# Patient Record
Sex: Male | Born: 1995 | Smoking: Former smoker
Health system: Southern US, Community
[De-identification: ages and names within clinical notes are randomized; demographics above are authoritative.]

## PROBLEM LIST (undated history)

## (undated) DIAGNOSIS — R569 Unspecified convulsions: Secondary | ICD-10-CM

## (undated) HISTORY — PX: OTHER SURGICAL HISTORY: SHX169

---
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
## 2016-06-04 ENCOUNTER — Emergency Department (HOSPITAL_COMMUNITY)
Admission: EM | Admit: 2016-06-04 | Discharge: 2016-06-04 | Disposition: A | Discharge status: Home / Self Care | Payer: Managed Care, Other (non HMO) | Attending: Emergency Medicine | Admitting: Emergency Medicine

## 2016-06-04 ENCOUNTER — Encounter (HOSPITAL_COMMUNITY): Payer: Self-pay | Admitting: Emergency Medicine

## 2016-06-04 DIAGNOSIS — R0981 Nasal congestion: Secondary | ICD-10-CM | POA: Insufficient documentation

## 2016-06-04 DIAGNOSIS — R05 Cough: Secondary | ICD-10-CM

## 2016-06-04 DIAGNOSIS — R059 Cough, unspecified: Secondary | ICD-10-CM

## 2016-06-04 DIAGNOSIS — J029 Acute pharyngitis, unspecified: Secondary | ICD-10-CM | POA: Insufficient documentation

## 2016-06-04 DIAGNOSIS — Z79899 Other long term (current) drug therapy: Secondary | ICD-10-CM | POA: Diagnosis not present

## 2016-06-04 MED ORDER — DEXAMETHASONE SODIUM PHOSPHATE 10 MG/ML IJ SOLN
10.0000 mg | Freq: Once | INTRAMUSCULAR | Status: AC
  Administered 2016-06-04: 10 mg via INTRAMUSCULAR
  Filled 2016-06-04: qty 1

## 2016-06-04 MED ORDER — PENICILLIN G BENZATHINE 1200000 UNIT/2ML IM SUSP
1.2000 10*6.[IU] | Freq: Once | INTRAMUSCULAR | Status: AC
  Administered 2016-06-04: 1.2 10*6.[IU] via INTRAMUSCULAR
  Filled 2016-06-04: qty 2

## 2016-06-04 NOTE — Discharge Instructions (Signed)
You have been treated for strep throat.  You were also given some steroids to help with the tonsillar swelling. Follow-up with your primary care doctor. Return here for new concerns.

## 2016-06-04 NOTE — ED Notes (Signed)
Pt. reports sore throat with swelling , productive cough / nasal congestion onset today . Denies fever or chills. Respirations unlabored /airway intact .

## 2016-06-04 NOTE — ED Notes (Signed)
PA at bedside.

## 2016-06-04 NOTE — ED Provider Notes (Signed)
CSN: [id number]     Arrival date & time 06/04/16  0101 History   First MD Initiated Contact with Patient 06/04/16 0136     Chief Complaint  Patient presents with  . Sore Throat     (Consider location/radiation/quality/duration/timing/severity/associated sxs/prior Treatment) Patient is a 20 y.o. male presenting with pharyngitis. The history is provided by the patient and medical records.  Sore Throat Associated symptoms include congestion, coughing and a sore throat.    20 y.o. M with hx of seizure disorder, presenting to the ED for sore throat, nasal congestion, and dry cough which began yesterday afternoon.  He denies fever or chills.  States today when he went to lay down in bed he had a sensation of throat swelling that resolved once he sat upright again.  He states hx of strep throat in the past several years ago.  Girlfriend recently sick with URI as well.  No meds tried PTA.  No known allergies.  No new foods.  No chest pain or shortness of breath. No nausea, vomiting, diarrhea. No abdominal pain.  VSS.  Past Medical History  Diagnosis Date  . Seizures (HCC)    History reviewed. No pertinent past surgical history. No family history on file. Social History  Substance Use Topics  . Smoking status: Never Smoker   . Smokeless tobacco: None  . Alcohol Use: Yes    Review of Systems  HENT: Positive for congestion and sore throat.   Respiratory: Positive for cough.   All other systems reviewed and are negative.     Allergies  Review of patient's allergies indicates no known allergies.  Home Medications   Prior to Admission medications   Medication Sig Start Date End Date Taking? Authorizing Provider  aluminum & magnesium hydroxide-simethicone (MYLANTA) 500-450-40 MG/5ML suspension Take 10-20 mLs by mouth daily as needed for indigestion.    Historical Provider, MD  Benzocaine 10 MG LOZG Use as directed 1 lozenge in the mouth or throat every 4 (four) hours as needed (for sore  throat).    Historical Provider, MD  diazepam (VALIUM) 5 MG/ML solution Take 10 mg by mouth once. For seizure ,  IM stat and call MD on staff    Historical Provider, MD  Multiple Vitamin (MULTIVITAMIN WITH MINERALS) TABS tablet Take 1 tablet by mouth daily.    Historical Provider, MD  polyethylene glycol (MIRALAX / GLYCOLAX) packet Take 17 g by mouth daily as needed for moderate constipation.    Historical Provider, MD  thiamine 100 MG tablet Take 100 mg by mouth daily.    Historical Provider, MD   BP 123/72 mmHg  Pulse 63  Temp(Src) 97.4 F (36.3 C) (Oral)  Resp 18  Ht 5\' 9"  (1.753 m)  Wt 79.833 kg  BMI 25.98 kg/m2  SpO2 98%   Physical Exam  Constitutional: He is oriented to person, place, and time. He appears well-developed and well-nourished. No distress.  HENT:  Head: Normocephalic and atraumatic.  Right Ear: Tympanic membrane and ear canal normal.  Left Ear: Tympanic membrane and ear canal normal.  Nose: Nose normal.  Mouth/Throat: Uvula is midline and mucous membranes are normal. Oropharyngeal exudate present. No tonsillar abscesses.  Tonsils 2+ bilaterally with small pocket of exudate noted on right tonsil; uvula midline without evidence of peritonsillar abscess; handling secretions appropriately; no difficulty swallowing or speaking; normal phonation without stridor  Eyes: Conjunctivae and EOM are normal. Pupils are equal, round, and reactive to light.  Neck: Normal range of motion. Neck supple.  Cardiovascular: Normal rate, regular rhythm and normal heart sounds.   Pulmonary/Chest: Effort normal and breath sounds normal. No respiratory distress. He has no wheezes.  Musculoskeletal: Normal range of motion.  Neurological: He is alert and oriented to person, place, and time.  Skin: Skin is warm and dry. He is not diaphoretic.  Psychiatric: He has a normal mood and affect.  Nursing note and vitals reviewed.   ED Course  Procedures (including critical care time) Labs  Review Labs Reviewed - No data to display  Imaging Review No results found. I have personally reviewed and evaluated these images and lab results as part of my medical decision-making.   EKG Interpretation None      MDM   Final diagnoses:  Sore throat  Nasal congestion  Cough   20 year old male here with sore throat, nasal congestion, and cough for the past 24 hours. Patient is afebrile, nontoxic. His lungs are clear without wheezes or rhonchi to suggest pneumonia. He has no cough on my exam. His tonsils are enlarged bilaterally with small pocket of exudate on the right tonsil. Uvula remains midline, handling secretions well, normal phonation without stridor. Airway patent.  Do not clinically suspect peritonsillar abscess or anaphylaxis, more likely strep pharyngitis. Will treat with Bicillin and Decadron here in the ED.  Discussed plan with patient, he acknowledged understanding and agreed with plan of care.  Return precautions given for new or worsening symptoms.  Garlon HatchetLisa M Sanders, PA-C 06/04/16 [id number]  Rolland PorterMark James, MD 06/15/16 [phone]

## 2018-03-13 ENCOUNTER — Ambulatory Visit: Payer: Managed Care, Other (non HMO) | Admitting: Family Medicine

## 2018-03-13 DIAGNOSIS — Z0289 Encounter for other administrative examinations: Secondary | ICD-10-CM

## 2018-03-24 ENCOUNTER — Encounter (HOSPITAL_COMMUNITY): Payer: Self-pay

## 2018-03-24 ENCOUNTER — Other Ambulatory Visit: Payer: Self-pay

## 2018-03-24 ENCOUNTER — Inpatient Hospital Stay (HOSPITAL_COMMUNITY)
Admission: EM | Admit: 2018-03-24 | Discharge: 2018-03-25 | DRG: 897 | Disposition: A | Discharge status: Home / Self Care | Payer: Managed Care, Other (non HMO) | Attending: Internal Medicine | Admitting: Internal Medicine

## 2018-03-24 ENCOUNTER — Emergency Department (HOSPITAL_COMMUNITY): Payer: Managed Care, Other (non HMO)

## 2018-03-24 ENCOUNTER — Ambulatory Visit (HOSPITAL_COMMUNITY): Payer: Self-pay

## 2018-03-24 DIAGNOSIS — F13239 Sedative, hypnotic or anxiolytic dependence with withdrawal, unspecified: Secondary | ICD-10-CM | POA: Diagnosis present

## 2018-03-24 DIAGNOSIS — S0012XA Contusion of left eyelid and periocular area, initial encounter: Secondary | ICD-10-CM | POA: Diagnosis present

## 2018-03-24 DIAGNOSIS — F1910 Other psychoactive substance abuse, uncomplicated: Secondary | ICD-10-CM | POA: Diagnosis not present

## 2018-03-24 DIAGNOSIS — G4089 Other seizures: Secondary | ICD-10-CM | POA: Diagnosis present

## 2018-03-24 DIAGNOSIS — F1110 Opioid abuse, uncomplicated: Secondary | ICD-10-CM | POA: Diagnosis present

## 2018-03-24 DIAGNOSIS — R569 Unspecified convulsions: Secondary | ICD-10-CM | POA: Diagnosis not present

## 2018-03-24 DIAGNOSIS — F10239 Alcohol dependence with withdrawal, unspecified: Secondary | ICD-10-CM | POA: Diagnosis present

## 2018-03-24 DIAGNOSIS — H05232 Hemorrhage of left orbit: Secondary | ICD-10-CM | POA: Diagnosis not present

## 2018-03-24 DIAGNOSIS — S0081XA Abrasion of other part of head, initial encounter: Secondary | ICD-10-CM | POA: Diagnosis present

## 2018-03-24 DIAGNOSIS — F1010 Alcohol abuse, uncomplicated: Secondary | ICD-10-CM | POA: Diagnosis present

## 2018-03-24 DIAGNOSIS — S0083XA Contusion of other part of head, initial encounter: Secondary | ICD-10-CM | POA: Diagnosis present

## 2018-03-24 DIAGNOSIS — F19230 Other psychoactive substance dependence with withdrawal, uncomplicated: Secondary | ICD-10-CM | POA: Diagnosis not present

## 2018-03-24 DIAGNOSIS — Z7151 Drug abuse counseling and surveillance of drug abuser: Secondary | ICD-10-CM | POA: Diagnosis not present

## 2018-03-24 DIAGNOSIS — T07XXXA Unspecified multiple injuries, initial encounter: Secondary | ICD-10-CM | POA: Diagnosis present

## 2018-03-24 DIAGNOSIS — F17210 Nicotine dependence, cigarettes, uncomplicated: Secondary | ICD-10-CM | POA: Diagnosis present

## 2018-03-24 DIAGNOSIS — F19239 Other psychoactive substance dependence with withdrawal, unspecified: Secondary | ICD-10-CM

## 2018-03-24 DIAGNOSIS — F19939 Other psychoactive substance use, unspecified with withdrawal, unspecified: Secondary | ICD-10-CM

## 2018-03-24 DIAGNOSIS — Z888 Allergy status to other drugs, medicaments and biological substances status: Secondary | ICD-10-CM | POA: Diagnosis not present

## 2018-03-24 DIAGNOSIS — E876 Hypokalemia: Secondary | ICD-10-CM | POA: Diagnosis present

## 2018-03-24 DIAGNOSIS — F19231 Other psychoactive substance dependence with withdrawal delirium: Secondary | ICD-10-CM | POA: Diagnosis not present

## 2018-03-24 LAB — RAPID URINE DRUG SCREEN, HOSP PERFORMED
Amphetamines: POSITIVE — AB
Barbiturates: NOT DETECTED
Benzodiazepines: POSITIVE — AB
Cocaine: NOT DETECTED
Opiates: NOT DETECTED
Tetrahydrocannabinol: POSITIVE — AB

## 2018-03-24 LAB — COMPREHENSIVE METABOLIC PANEL
ALT: 18 U/L (ref 17–63)
AST: 28 U/L (ref 15–41)
Albumin: 4.4 g/dL (ref 3.5–5.0)
Alkaline Phosphatase: 47 U/L (ref 38–126)
Anion gap: 10 (ref 5–15)
BUN: 5 mg/dL — ABNORMAL LOW (ref 6–20)
CO2: 21 mmol/L — ABNORMAL LOW (ref 22–32)
Calcium: 9 mg/dL (ref 8.9–10.3)
Chloride: 106 mmol/L (ref 101–111)
Creatinine, Ser: 0.9 mg/dL (ref 0.61–1.24)
GFR calc Af Amer: >60 mL/min (ref >60)
GFR calc non Af Amer: >60 mL/min (ref >60)
Glucose, Bld: 103 mg/dL — ABNORMAL HIGH (ref 65–99)
Potassium: 3.7 mmol/L (ref 3.5–5.1)
Sodium: 137 mmol/L (ref 135–145)
Total Bilirubin: 1.7 mg/dL — ABNORMAL HIGH (ref 0.3–1.2)
Total Protein: 6.5 g/dL (ref 6.5–8.1)

## 2018-03-24 LAB — CBC WITH DIFFERENTIAL/PLATELET
Basophils Absolute: 0 10*3/uL (ref 0.0–0.1)
Basophils Relative: 0 %
Eosinophils Absolute: 0 10*3/uL (ref 0.0–0.7)
Eosinophils Relative: 0 %
HCT: 42.5 % (ref 39.0–52.0)
Hemoglobin: 15.2 g/dL (ref 13.0–17.0)
Lymphocytes Relative: 9 %
Lymphs Abs: 0.8 10*3/uL (ref 0.7–4.0)
MCH: 29.5 pg (ref 26.0–34.0)
MCHC: 35.8 g/dL (ref 30.0–36.0)
MCV: 82.5 fL (ref 78.0–100.0)
Monocytes Absolute: 0.3 10*3/uL (ref 0.1–1.0)
Monocytes Relative: 3 %
Neutro Abs: 7.9 10*3/uL — ABNORMAL HIGH (ref 1.7–7.7)
Neutrophils Relative %: 88 %
Platelets: 235 10*3/uL (ref 150–400)
RBC: 5.15 MIL/uL (ref 4.22–5.81)
RDW: 12.1 % (ref 11.5–15.5)
WBC: 9 10*3/uL (ref 4.0–10.5)

## 2018-03-24 LAB — PHOSPHORUS: Phosphorus: 1.3 mg/dL — ABNORMAL LOW (ref 2.5–4.6)

## 2018-03-24 LAB — TSH: TSH: 0.365 u[IU]/mL (ref 0.350–4.500)

## 2018-03-24 LAB — MAGNESIUM: Magnesium: 2.5 mg/dL — ABNORMAL HIGH (ref 1.7–2.4)

## 2018-03-24 MED ORDER — ONDANSETRON HCL 4 MG/2ML IJ SOLN: 4.0000 mg | Freq: Four times a day (QID) | INTRAMUSCULAR | Status: DC | PRN

## 2018-03-24 MED ORDER — IBUPROFEN 400 MG PO TABS
400.0000 mg | ORAL_TABLET | Freq: Four times a day (QID) | ORAL | Status: DC | PRN
  Administered 2018-03-24 – 2018-03-25 (×2): 400 mg via ORAL
  Filled 2018-03-24 (×2): qty 1

## 2018-03-24 MED ORDER — LORAZEPAM 2 MG/ML IJ SOLN
0.0000 mg | Freq: Four times a day (QID) | INTRAMUSCULAR | Status: DC
  Administered 2018-03-24: 2 mg via INTRAVENOUS
  Filled 2018-03-24: qty 1

## 2018-03-24 MED ORDER — LORAZEPAM 2 MG/ML IJ SOLN
2.0000 mg | INTRAMUSCULAR | Status: DC | PRN
  Administered 2018-03-25 (×2): 2 mg via INTRAVENOUS
  Filled 2018-03-24 (×2): qty 1

## 2018-03-24 MED ORDER — SODIUM CHLORIDE 0.9% FLUSH
3.0000 mL | Freq: Two times a day (BID) | INTRAVENOUS | Status: DC
  Administered 2018-03-24: 3 mL via INTRAVENOUS

## 2018-03-24 MED ORDER — SODIUM CHLORIDE 0.9 % IV BOLUS
1000.0000 mL | Freq: Once | INTRAVENOUS | Status: AC
  Administered 2018-03-24: 1000 mL via INTRAVENOUS

## 2018-03-24 MED ORDER — BACITRACIN-NEOMYCIN-POLYMYXIN OINTMENT TUBE
TOPICAL_OINTMENT | Freq: Every day | CUTANEOUS | Status: DC
  Administered 2018-03-24: 23:00:00 via TOPICAL
  Administered 2018-03-25: 1 via TOPICAL
  Filled 2018-03-24 (×2): qty 1
  Filled 2018-03-24: qty 14.17

## 2018-03-24 MED ORDER — TETANUS-DIPHTH-ACELL PERTUSSIS 5-2.5-18.5 LF-MCG/0.5 IM SUSP
0.5000 mL | Freq: Once | INTRAMUSCULAR | Status: AC
  Administered 2018-03-24: 0.5 mL via INTRAMUSCULAR
  Filled 2018-03-24: qty 0.5

## 2018-03-24 MED ORDER — LORAZEPAM 1 MG PO TABS: 0.0000 mg | ORAL_TABLET | Freq: Two times a day (BID) | ORAL | Status: DC

## 2018-03-24 MED ORDER — ACETAMINOPHEN 650 MG RE SUPP: 650.0000 mg | Freq: Four times a day (QID) | RECTAL | Status: DC | PRN

## 2018-03-24 MED ORDER — KCL IN DEXTROSE-NACL 20-5-0.9 MEQ/L-%-% IV SOLN: INTRAVENOUS | Status: DC

## 2018-03-24 MED ORDER — VITAMIN B-1 100 MG PO TABS
100.0000 mg | ORAL_TABLET | Freq: Every day | ORAL | Status: DC
  Administered 2018-03-24: 100 mg via ORAL
  Filled 2018-03-24: qty 1

## 2018-03-24 MED ORDER — NICOTINE 14 MG/24HR TD PT24
14.0000 mg | MEDICATED_PATCH | Freq: Every day | TRANSDERMAL | Status: DC
  Administered 2018-03-24 – 2018-03-25 (×2): 14 mg via TRANSDERMAL
  Filled 2018-03-24 (×2): qty 1

## 2018-03-24 MED ORDER — KCL IN DEXTROSE-NACL 20-5-0.9 MEQ/L-%-% IV SOLN
INTRAVENOUS | Status: AC
  Administered 2018-03-24: 23:00:00 via INTRAVENOUS
  Filled 2018-03-24: qty 1000

## 2018-03-24 MED ORDER — LORAZEPAM 2 MG/ML IJ SOLN: 0.0000 mg | Freq: Two times a day (BID) | INTRAMUSCULAR | Status: DC

## 2018-03-24 MED ORDER — THIAMINE HCL 100 MG/ML IJ SOLN: 100.0000 mg | Freq: Every day | INTRAMUSCULAR | Status: DC

## 2018-03-24 MED ORDER — ENOXAPARIN SODIUM 40 MG/0.4ML ~~LOC~~ SOLN: 40.0000 mg | SUBCUTANEOUS | Status: DC

## 2018-03-24 MED ORDER — LORAZEPAM 1 MG PO TABS: 0.0000 mg | ORAL_TABLET | Freq: Four times a day (QID) | ORAL | Status: DC

## 2018-03-24 MED ORDER — ONDANSETRON HCL 4 MG PO TABS: 4.0000 mg | ORAL_TABLET | Freq: Four times a day (QID) | ORAL | Status: DC | PRN

## 2018-03-24 MED ORDER — ACETAMINOPHEN 325 MG PO TABS: 650.0000 mg | ORAL_TABLET | Freq: Four times a day (QID) | ORAL | Status: DC | PRN

## 2018-03-24 MED ORDER — POTASSIUM PHOSPHATES 15 MMOLE/5ML IV SOLN
30.0000 mmol | Freq: Once | INTRAVENOUS | Status: AC
  Administered 2018-03-24: 30 mmol via INTRAVENOUS
  Filled 2018-03-24: qty 10

## 2018-03-24 MED ORDER — ENSURE ENLIVE PO LIQD
237.0000 mL | Freq: Two times a day (BID) | ORAL | Status: DC
  Administered 2018-03-25: 237 mL via ORAL

## 2018-03-24 NOTE — ED Notes (Signed)
Peer support bedside at this time

## 2018-03-24 NOTE — ED Triage Notes (Signed)
[facility] with complaints of seizure lasting approx 2 mins per pt's professor. EMS reports pt hit left side of head after falling out of desk; pt has no seizure history nor memory of today's seizure. Pt was not postictal for EMS upon arrival, zofran given en route, no nausea at this time. Pt placed in position of comfort with bed locked and lowered, call bell in reach.

## 2018-03-24 NOTE — ED Notes (Signed)
Pt's mother and mother's friend to nurses' station stating their concern for pt's mental health instability, requesting pt have BH evaluation and become IVC'd for substance abuse treatment. Mother and friend discussing concerns with PA at this time

## 2018-03-24 NOTE — ED Notes (Signed)
Per pt request, please do NOT discuss pt info with family

## 2018-03-24 NOTE — H&P (Addendum)
History and Physical    Gordon Gonzalez:[id number] DOB: November 28, 1996 DOA: 03/24/2018  **Will admit patient based on the expectation that the patient will need hospitalization/ hospital care that crosses at least 2 midnights  PCP: Default, Provider, MD   Attending physician: [facility]  Patient coming from/Resides with: Private residence  Chief Complaint: Witnessed tonic-clonic seizure activity  HPI: Gordon Gonzalez is a 22 y.o. male with medical history significant for polysubstance abuse with primary drug of choice alcohol.  Was last admitted to this facility in September 2015 with 2 days of ongoing nausea vomiting and abdominal pain.  She was found to be profoundly constipated in the context of narcotic bowel.  Was returned to [facility] for detox and continued utilization of Librium, Catapres and Robaxin.  Since that time patient has struggled with polysubstance abuse and typically drinks 1 large block over a 48-hour period.  In addition he admits to using opiates cocaine marijuana.  Over the past 2 days he quit "cold [place]" and apparently was able to obtain at least 1 dose of Xanax to take.  Of note patient is involved with a behavioral health specialist affiliated with [facility] who has been working aggressively with this patient in the outpatient setting to assist with his polysubstance abuse.  Today while talking to a professor in class he had 2 minutes of tonic-clonic seizure activity.  He fell against the floor.  He had no bowel or bladder incontinence.  He was noted to have a left supraorbital hematoma with facial abrasions on the left side after the seizure activity.  He was brought to the ER and has no further seizure activity.  He was afebrile and normotensive.  He is not hypoxemic.  Imaging including CT of the head and cervical spine unremarkable.  His initial CIWA score was somewhat elevated at 11 and he has been started on an Ativan withdrawal protocol.  Patient reported to EDP that  he was concerned over possible hepatitis infection although denies recent IV drug use.  He is also asked that no family members including his mother and her friend be made aware of his current medical status nor should his condition be discussed with him.  He does report that his mother is aware of his ongoing substance abuse problem and informed me that "really do not have anything else to tell her that she does not already know."  ED Course:  Vital Signs: BP 112/73   Pulse 63   Resp 19   Ht 5\' 9"  (1.753 m)   Wt 79.4 kg (175 lb)   SpO2 100%   BMI 25.84 kg/m  CT head, cervical spine and maxillofacial: Negative except for left small periorbital and malar soft tissue hematomas Lab data: 7, potassium 3.7, chloride 106, CO2 21, glucose 105, BUN 5, creatinine 0.9, phosphorus 1.3, magnesium 2.5, total bilirubin 1.7, white count 9000 with neutrophils 88% neutrophils 7.9%, hemoglobin 15.2, platelets 235,000, TSH 0.365 Medications and treatments: We will saline bolus times 1 L, Ativan 2 mg IV x1, thiamine 100 mg p.o. x1, Tdap injection x1  Review of Systems:  In addition to the HPI above,  No Fever-chills, myalgias or other constitutional symptoms No Headache, changes with Vision or hearing, new weakness, tingling, numbness in any extremity, dizziness, dysarthria or word finding difficulty, gait disturbance or imbalance No problems swallowing food or Liquids, indigestion/reflux, choking or coughing while eating, abdominal pain with or after eating No Chest pain, Cough or Shortness of Breath, palpitations, orthopnea or  DOE No Abdominal pain, N/V, melena,hematochezia, dark tarry stools, constipation No dysuria, malodorous urine, hematuria or flank pain No new skin rashes, lesions No new joint pains, aches, swelling or redness No recent unintentional weight gain or loss No polyuria, polydypsia or polyphagia   Past Medical History:  Diagnosis Date  . Seizures (HCC)     History reviewed. No  pertinent surgical history.  Social History   Socioeconomic History  . Marital status: Single    Spouse name: Not on file  . Number of children: Not on file  . Years of education: Not on file  . Highest education level: Not on file  Occupational History  . Not on file  Social Needs  . Financial resource strain: Not on file  . Food insecurity:    Worry: Not on file    Inability: Not on file  . Transportation needs:    Medical: Not on file    Non-medical: Not on file  Tobacco Use  . Smoking status: Never Smoker  . Smokeless tobacco: Never Used  Substance and Sexual Activity  . Alcohol use: Yes  . Drug use: Yes    Types: Cocaine    Comment: Opiates  . Sexual activity: Not on file  Lifestyle  . Physical activity:    Days per week: Not on file    Minutes per session: Not on file  . Stress: Not on file  Relationships  . Social connections:    Talks on phone: Not on file    Gets together: Not on file    Attends religious service: Not on file    Active member of club or organization: Not on file    Attends meetings of clubs or organizations: Not on file    Relationship status: Not on file  . Intimate partner violence:    Fear of current or ex partner: Not on file    Emotionally abused: Not on file    Physically abused: Not on file    Forced sexual activity: Not on file  Other Topics Concern  . Not on file  Social History Narrative  . 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 Wellbutrin [Bupropion] Hives and Swelling    Family history reviewed and not pertinent to current admission findings or diagnosis  Prior to Admission medications   Not on File    Physical Exam: Vitals:   03/24/18 1400 03/24/18 1433 03/24/18 1530 03/24/18 1600  BP:  99/60 116/64 112/73  Pulse:  90 89 63  Resp:  18 13 19   SpO2:  99% 100% 100%  Weight: 79.4 kg (175 lb)       Height: 5\' 9"  (1.753 m)         Constitutional: NAD, calm, uncomfortable secondary to left periorbital hematoma Eyes: PERRL, lids and conjunctivae normal, left periorbital hematoma ENMT: Mucous membranes are moist. Posterior pharynx clear of any exudate or lesions.Normal dentition.  Neck: normal, supple, no masses, no thyromegaly Respiratory: clear to auscultation bilaterally, no wheezing, no crackles. Normal respiratory effort. No accessory muscle use.  Cardiovascular: Regular rate and rhythm, no murmurs / rubs / gallops. No extremity edema. 2+ pedal pulses. No carotid bruits.  Abdomen: no tenderness, no masses palpated. No hepatosplenomegaly. Bowel sounds positive.  Musculoskeletal: no clubbing / cyanosis. No joint deformity upper and lower extremities. Good ROM, no contractures. Normal muscle tone.  Skin: no rashes,  lesions, ulcers. No induration, several scattered left upper face abrasions Neurologic: CN 2-12 grossly intact. Sensation intact, DTR normal. Strength 5/5 x all 4 extremities.  Psychiatric: Normal judgment and insight. Alert and oriented x 3. Normal mood.    Labs on Admission: I have personally reviewed following labs and imaging studies  CBC: Recent Labs  Lab 03/24/18 1504  WBC 9.0  NEUTROABS 7.9*  HGB 15.2  HCT 42.5  MCV 82.5  PLT 235   Basic Metabolic Panel: Recent Labs  Lab 03/24/18 1504  NA 137  K 3.7  CL 106  CO2 21*  GLUCOSE 103*  BUN 5*  CREATININE 0.90  CALCIUM 9.0  MG 2.5*  PHOS 1.3*   GFR: Estimated Creatinine Clearance: 128.7 mL/min (by C-G formula based on SCr of 0.9 mg/dL). Liver Function Tests: Recent Labs  Lab 03/24/18 1504  AST 28  ALT 18  ALKPHOS 47  BILITOT 1.7*  PROT 6.5  ALBUMIN 4.4   No results for input(s): LIPASE, AMYLASE in the last 168 hours. No results for input(s): AMMONIA in the last 168 hours. Coagulation Profile: No results for input(s): INR, PROTIME in the last 168 hours. Cardiac Enzymes: No results for  input(s): CKTOTAL, CKMB, CKMBINDEX, TROPONINI in the last 168 hours. BNP (last 3 results) No results for input(s): PROBNP in the last 8760 hours. HbA1C: No results for input(s): HGBA1C in the last 72 hours. CBG: No results for input(s): GLUCAP in the last 168 hours. Lipid Profile: No results for input(s): CHOL, HDL, LDLCALC, TRIG, CHOLHDL, LDLDIRECT in the last 72 hours. Thyroid Function Tests: Recent Labs    03/24/18 1504  TSH 0.365   Anemia Panel: No results for input(s): VITAMINB12, FOLATE, FERRITIN, TIBC, IRON, RETICCTPCT in the last 72 hours. Urine analysis:    Component Value Date/Time   COLORURINE YELLOW 08/28/2014 1707   APPEARANCEUR CLOUDY (A) 08/28/2014 1707   APPEARANCEUR Clear 04/24/2014 1535   LABSPEC 1.012 08/28/2014 1707   LABSPEC 1.005 04/24/2014 1535   PHURINE 7.5 08/28/2014 1707   GLUCOSEU NEGATIVE 08/28/2014 1707   GLUCOSEU Negative 04/24/2014 1535   HGBUR NEGATIVE 08/28/2014 1707   BILIRUBINUR NEGATIVE 08/28/2014 1707   BILIRUBINUR Negative 04/24/2014 1535   KETONESUR NEGATIVE 08/28/2014 1707   PROTEINUR NEGATIVE 08/28/2014 1707   UROBILINOGEN 0.2 08/28/2014 1707   NITRITE NEGATIVE 08/28/2014 1707   LEUKOCYTESUR NEGATIVE 08/28/2014 1707   LEUKOCYTESUR Negative 04/24/2014 1535   Sepsis Labs: @LABRCNTIP (procalcitonin:4,lacticidven:4) )No results found for this or any previous visit (from the past 240 hour(s)).   Radiological Exams on Admission: Ct Head Wo Contrast  Result Date: 03/24/2018 CLINICAL DATA:  Headache, neck pain, and facial lacerations after seizure with fall today. EXAM: CT HEAD WITHOUT CONTRAST CT MAXILLOFACIAL WITHOUT CONTRAST CT CERVICAL SPINE WITHOUT CONTRAST TECHNIQUE: Multidetector CT imaging of the head, cervical spine, and maxillofacial structures were performed using the standard protocol without intravenous contrast. Multiplanar CT image reconstructions of the cervical spine and maxillofacial structures were also generated.  COMPARISON:  CT head dated Apr 24, 2014. FINDINGS: CT HEAD FINDINGS Brain: No evidence of acute infarction, hemorrhage, hydrocephalus, extra-axial collection or mass lesion/mass effect. Vascular: No hyperdense vessel or unexpected calcification. Skull: Normal. Negative for fracture or focal lesion. Other: None. CT MAXILLOFACIAL FINDINGS Osseous: No fracture or mandibular dislocation. No destructive process. Orbits: Negative. No traumatic or inflammatory finding. Sinuses: Clear. Soft tissues: Small left periorbital and malar soft tissue hematomas. CT CERVICAL SPINE FINDINGS Alignment: Normal. Skull base and vertebrae: No acute fracture. No primary bone lesion  or focal pathologic process. Soft tissues and spinal canal: No prevertebral fluid or swelling. No visible canal hematoma. Disc levels:  Normal. Upper chest: Negative. Other: None. IMPRESSION: 1.  No acute intracranial abnormality. 2. No acute facial fracture. Small left periorbital and malar soft tissue hematomas. 3.  No acute cervical spine fracture. Electronically Signed   By: Obie Dredge M.D.   On: 03/24/2018 15:51   Ct Cervical Spine Wo Contrast  Result Date: 03/24/2018 CLINICAL DATA:  Headache, neck pain, and facial lacerations after seizure with fall today. EXAM: CT HEAD WITHOUT CONTRAST CT MAXILLOFACIAL WITHOUT CONTRAST CT CERVICAL SPINE WITHOUT CONTRAST TECHNIQUE: Multidetector CT imaging of the head, cervical spine, and maxillofacial structures were performed using the standard protocol without intravenous contrast. Multiplanar CT image reconstructions of the cervical spine and maxillofacial structures were also generated. COMPARISON:  CT head dated Apr 24, 2014. FINDINGS: CT HEAD FINDINGS Brain: No evidence of acute infarction, hemorrhage, hydrocephalus, extra-axial collection or mass lesion/mass effect. Vascular: No hyperdense vessel or unexpected calcification. Skull: Normal. Negative for fracture or focal lesion. Other: None. CT  MAXILLOFACIAL FINDINGS Osseous: No fracture or mandibular dislocation. No destructive process. Orbits: Negative. No traumatic or inflammatory finding. Sinuses: Clear. Soft tissues: Small left periorbital and malar soft tissue hematomas. CT CERVICAL SPINE FINDINGS Alignment: Normal. Skull base and vertebrae: No acute fracture. No primary bone lesion or focal pathologic process. Soft tissues and spinal canal: No prevertebral fluid or swelling. No visible canal hematoma. Disc levels:  Normal. Upper chest: Negative. Other: None. IMPRESSION: 1.  No acute intracranial abnormality. 2. No acute facial fracture. Small left periorbital and malar soft tissue hematomas. 3.  No acute cervical spine fracture. Electronically Signed   By: Obie Dredge M.D.   On: 03/24/2018 15:51   Ct Maxillofacial Wo Cm  Result Date: 03/24/2018 CLINICAL DATA:  Headache, neck pain, and facial lacerations after seizure with fall today. EXAM: CT HEAD WITHOUT CONTRAST CT MAXILLOFACIAL WITHOUT CONTRAST CT CERVICAL SPINE WITHOUT CONTRAST TECHNIQUE: Multidetector CT imaging of the head, cervical spine, and maxillofacial structures were performed using the standard protocol without intravenous contrast. Multiplanar CT image reconstructions of the cervical spine and maxillofacial structures were also generated. COMPARISON:  CT head dated Apr 24, 2014. FINDINGS: CT HEAD FINDINGS Brain: No evidence of acute infarction, hemorrhage, hydrocephalus, extra-axial collection or mass lesion/mass effect. Vascular: No hyperdense vessel or unexpected calcification. Skull: Normal. Negative for fracture or focal lesion. Other: None. CT MAXILLOFACIAL FINDINGS Osseous: No fracture or mandibular dislocation. No destructive process. Orbits: Negative. No traumatic or inflammatory finding. Sinuses: Clear. Soft tissues: Small left periorbital and malar soft tissue hematomas. CT CERVICAL SPINE FINDINGS Alignment: Normal. Skull base and vertebrae: No acute fracture. No  primary bone lesion or focal pathologic process. Soft tissues and spinal canal: No prevertebral fluid or swelling. No visible canal hematoma. Disc levels:  Normal. Upper chest: Negative. Other: None. IMPRESSION: 1.  No acute intracranial abnormality. 2. No acute facial fracture. Small left periorbital and malar soft tissue hematomas. 3.  No acute cervical spine fracture. Electronically Signed   By: Obie Dredge M.D.   On: 03/24/2018 15:51     Assessment/Plan Principal Problem:   Withdrawal seizures/ Alcohol dependence with withdrawal with complication  -Patient presents with witnessed tonic-clonic seizure activity in the context of abrupt cessation of heavy alcohol use eating seizure activity occurred 48 hours after cessation (vodka 1/5 over 2 days) -Stepdown Ativan CIWA protocol -Has had no further seizure activity since arrival and no history of seizures-any  seizure precautions for now -Allow clear liquid diet and advance to regular as long as remains seizure-free-advance diet beginning at 9 PM -Patient actively involved in outpatient behavioral health tx with counselor at bedside while in ER-Pt plans to continue aggressive treatment for his drug and alcohol addiction-he has desire to finish this semester at [employer] and eventually graduate with a degree in computer science  Active Problems:    Polysubstance abuse  -History of prior utilization of benzodiazepines, cocaine, meth -Follow-up on urine drug screen -Patient requests screening for hepatitis; will also obtain HIV    Periorbital hematoma of left eye/Multiple abrasions -Ice pack prn to hematoma -Neosporin ointment to abrasions -Tylenol for mild pain -Ibuprofen for moderate pain    Hypophosphatemia -Associated with borderline hypokalemia so we will give K-Phos 30 mmol x1 and follow labs    **Additional lab, imaging and/or diagnostic evaluation at discretion of supervising physician  DVT prophylaxis: Lovenox Code Status:  Full Family Communication: Patient requested family not be made aware of ongoing medical condition and diagnoses-outpatient counselor at bedside and updated on status Disposition Plan: Home Consults called: None    ELLIS,ALLISON L. ANP-BC [facility] Pager [phone]   If 7PM-7AM, please contact night-coverage [email or web address] Password [facility]  03/24/2018, 5:55 PM

## 2018-03-24 NOTE — ED Provider Notes (Signed)
[facility] EMERGENCY DEPARTMENT Provider Note   CSN: [id number] Arrival date & time: 03/24/18  1353     History   Chief Complaint Chief Complaint  Patient presents with  . Seizures    HPI Gordon Gonzalez is a 22 y.o. male with a past medical history of polysubstance abuse, alcohol abuse, who quit all substances "cold [place]" on the morning of 03/22/18, presents today for evaluation of a seizure.  He was reportedly speaking with a professor who has a history of substance abuse when he had an approximately 2-minute tonic-clonic type seizure.  Patient denies any memory of this event.    She does not have any personal or family history of seizures or epilepsy.  He reports that in the past week he has used 2 pills of Xanax, alcohol, hallucinogens, marijuana, and meth.  He reports that alcohol is his primary drug of choice and that he normally gets a large bottle of vodka and will drink it in 2 days.  He reports that he has also used opioids, cocaine, marijuana, and other drugs.  He is unsure when his last tetanus shot was.  He previously used substances however in 2016 he got clean.  He reports that he relapsed in November when he started drinking again and has progressed to using multiple drugs.     HPI  Past Medical History:  Diagnosis Date  . Seizures [facility])     Patient Active Problem List   Diagnosis Date Noted  . Polysubstance abuse (HCC) 03/24/2018  . Alcohol dependence with withdrawal with complication (HCC) 03/24/2018  . Withdrawal seizures (HCC) 03/24/2018  . Opioid abuse (HCC) 08/29/2014  . Unspecified constipation 08/29/2014  . Admitted to substance misuse detoxification center 08/29/2014  . Colonic obstruction (HCC) 08/28/2014    History reviewed. No pertinent surgical history.      Home Medications    Prior to Admission medications   Not on File    Family History History reviewed. No pertinent family history.  Social History Social  History   Tobacco Use  . Smoking status: Never Smoker  . Smokeless tobacco: Never Used  Substance Use Topics  . Alcohol use: Yes  . Drug use: Yes    Types: Cocaine    Comment: Opiates     Allergies   Wellbutrin [bupropion]   Review of Systems Review of Systems  Constitutional: Negative for chills and fever.  HENT: Negative for congestion, postnasal drip and rhinorrhea.   Eyes: Negative for visual disturbance.  Respiratory: Negative for shortness of breath.   Cardiovascular: Negative for chest pain.  Gastrointestinal: Positive for nausea. Negative for abdominal pain, diarrhea and vomiting.  Genitourinary: Negative for dysuria.  Skin: Negative for color change and rash.  Neurological: Positive for seizures and headaches. Negative for dizziness, speech difficulty, weakness and numbness.  Psychiatric/Behavioral: Negative for confusion.  All other systems reviewed and are negative.    Physical Exam Updated Vital Signs BP 112/73   Pulse 63   Resp 19   Ht 5\' 9"  (1.753 m)   Wt 79.4 kg (175 lb)   SpO2 100%   BMI 25.84 kg/m   Physical Exam  Constitutional: He is oriented to person, place, and time. He appears well-developed and well-nourished. No distress.  HENT:  Head: Normocephalic.  Swelling over the left anterior zygomatic arch with TTP.  There is a small area of bleeding.  There is swelling over the left lateral orbit with TTP.  No ocular entrapment.  Abrasions on  left side of chin.   Eyes: Pupils are equal, round, and reactive to light. Conjunctivae and EOM are normal. Right conjunctiva has no hemorrhage. Left conjunctiva has no hemorrhage.  Neck:  Slight midline TTP.   Cardiovascular: Normal rate, regular rhythm, normal heart sounds and intact distal pulses.  No murmur heard. Pulmonary/Chest: Effort normal and breath sounds normal. No respiratory distress.  Abdominal: Soft. There is no tenderness.  Musculoskeletal: He exhibits no edema.  Neurological: He is alert  and oriented to person, place, and time. No sensory deficit.  Mental Status:  Alert, oriented, thought content appropriate, able to give a coherent history. Speech fluent without evidence of aphasia. Able to follow 2 step commands without difficulty.  Cranial Nerves:  II:  Peripheral visual fields grossly normal, pupils equal, round, reactive to light III,IV, VI: ptosis not present, extra-ocular motions intact bilaterally  V,VII: smile symmetric, facial light touch sensation equal VIII: hearing grossly normal to voice  X: uvula elevates symmetrically  XI: bilateral shoulder shrug symmetric and strong XII: midline tongue extension without fassiculations Motor:  Normal tone. 5/5 in upper and lower extremities bilaterally including strong and equal grip strength and dorsiflexion/plantar flexion Cerebellar: normal finger-to-nose with bilateral upper extremities CV: distal pulses palpable throughout    Skin: Skin is warm and dry. He is not diaphoretic.  Psychiatric: He has a normal mood and affect. His speech is normal. He is withdrawn. He expresses no homicidal and no suicidal ideation. He expresses no suicidal plans and no homicidal plans.  Patient denies AVH, SI, or HI.  He denies feeling depressed, hopeless, worthless or questioning if life is worth living.   Nursing note and vitals reviewed.    ED Treatments / Results  Labs (all labs ordered are listed, but only abnormal results are displayed) Labs Reviewed  COMPREHENSIVE METABOLIC PANEL - Abnormal; Notable for the following components:      Result Value   CO2 21 (*)    Glucose, Bld 103 (*)    BUN 5 (*)    Total Bilirubin 1.7 (*)    All other components within normal limits  CBC WITH DIFFERENTIAL/PLATELET - Abnormal; Notable for the following components:   Neutro Abs 7.9 (*)    All other components within normal limits  TSH  RAPID URINE DRUG SCREEN, HOSP PERFORMED  MAGNESIUM  PHOSPHORUS    EKG None  Radiology Ct Head Wo  Contrast  Result Date: 03/24/2018 CLINICAL DATA:  Headache, neck pain, and facial lacerations after seizure with fall today. EXAM: CT HEAD WITHOUT CONTRAST CT MAXILLOFACIAL WITHOUT CONTRAST CT CERVICAL SPINE WITHOUT CONTRAST TECHNIQUE: Multidetector CT imaging of the head, cervical spine, and maxillofacial structures were performed using the standard protocol without intravenous contrast. Multiplanar CT image reconstructions of the cervical spine and maxillofacial structures were also generated. COMPARISON:  CT head dated Apr 24, 2014. FINDINGS: CT HEAD FINDINGS Brain: No evidence of acute infarction, hemorrhage, hydrocephalus, extra-axial collection or mass lesion/mass effect. Vascular: No hyperdense vessel or unexpected calcification. Skull: Normal. Negative for fracture or focal lesion. Other: None. CT MAXILLOFACIAL FINDINGS Osseous: No fracture or mandibular dislocation. No destructive process. Orbits: Negative. No traumatic or inflammatory finding. Sinuses: Clear. Soft tissues: Small left periorbital and malar soft tissue hematomas. CT CERVICAL SPINE FINDINGS Alignment: Normal. Skull base and vertebrae: No acute fracture. No primary bone lesion or focal pathologic process. Soft tissues and spinal canal: No prevertebral fluid or swelling. No visible canal hematoma. Disc levels:  Normal. Upper chest: Negative. Other: None. IMPRESSION: 1.  No acute intracranial abnormality. 2. No acute facial fracture. Small left periorbital and malar soft tissue hematomas. 3.  No acute cervical spine fracture. Electronically Signed   By: Obie Dredge M.D.   On: 03/24/2018 15:51   Ct Cervical Spine Wo Contrast  Result Date: 03/24/2018 CLINICAL DATA:  Headache, neck pain, and facial lacerations after seizure with fall today. EXAM: CT HEAD WITHOUT CONTRAST CT MAXILLOFACIAL WITHOUT CONTRAST CT CERVICAL SPINE WITHOUT CONTRAST TECHNIQUE: Multidetector CT imaging of the head, cervical spine, and maxillofacial structures were  performed using the standard protocol without intravenous contrast. Multiplanar CT image reconstructions of the cervical spine and maxillofacial structures were also generated. COMPARISON:  CT head dated Apr 24, 2014. FINDINGS: CT HEAD FINDINGS Brain: No evidence of acute infarction, hemorrhage, hydrocephalus, extra-axial collection or mass lesion/mass effect. Vascular: No hyperdense vessel or unexpected calcification. Skull: Normal. Negative for fracture or focal lesion. Other: None. CT MAXILLOFACIAL FINDINGS Osseous: No fracture or mandibular dislocation. No destructive process. Orbits: Negative. No traumatic or inflammatory finding. Sinuses: Clear. Soft tissues: Small left periorbital and malar soft tissue hematomas. CT CERVICAL SPINE FINDINGS Alignment: Normal. Skull base and vertebrae: No acute fracture. No primary bone lesion or focal pathologic process. Soft tissues and spinal canal: No prevertebral fluid or swelling. No visible canal hematoma. Disc levels:  Normal. Upper chest: Negative. Other: None. IMPRESSION: 1.  No acute intracranial abnormality. 2. No acute facial fracture. Small left periorbital and malar soft tissue hematomas. 3.  No acute cervical spine fracture. Electronically Signed   By: Obie Dredge M.D.   On: 03/24/2018 15:51   Ct Maxillofacial Wo Cm  Result Date: 03/24/2018 CLINICAL DATA:  Headache, neck pain, and facial lacerations after seizure with fall today. EXAM: CT HEAD WITHOUT CONTRAST CT MAXILLOFACIAL WITHOUT CONTRAST CT CERVICAL SPINE WITHOUT CONTRAST TECHNIQUE: Multidetector CT imaging of the head, cervical spine, and maxillofacial structures were performed using the standard protocol without intravenous contrast. Multiplanar CT image reconstructions of the cervical spine and maxillofacial structures were also generated. COMPARISON:  CT head dated Apr 24, 2014. FINDINGS: CT HEAD FINDINGS Brain: No evidence of acute infarction, hemorrhage, hydrocephalus, extra-axial collection  or mass lesion/mass effect. Vascular: No hyperdense vessel or unexpected calcification. Skull: Normal. Negative for fracture or focal lesion. Other: None. CT MAXILLOFACIAL FINDINGS Osseous: No fracture or mandibular dislocation. No destructive process. Orbits: Negative. No traumatic or inflammatory finding. Sinuses: Clear. Soft tissues: Small left periorbital and malar soft tissue hematomas. CT CERVICAL SPINE FINDINGS Alignment: Normal. Skull base and vertebrae: No acute fracture. No primary bone lesion or focal pathologic process. Soft tissues and spinal canal: No prevertebral fluid or swelling. No visible canal hematoma. Disc levels:  Normal. Upper chest: Negative. Other: None. IMPRESSION: 1.  No acute intracranial abnormality. 2. No acute facial fracture. Small left periorbital and malar soft tissue hematomas. 3.  No acute cervical spine fracture. Electronically Signed   By: Obie Dredge M.D.   On: 03/24/2018 15:51    Procedures Procedures     Medications Ordered in ED Medications  LORazepam (ATIVAN) injection 2-3 mg (has no administration in time range)  dextrose 5 % and 0.9 % NaCl with KCl 20 mEq/L infusion (has no administration in time range)  sodium chloride 0.9 % bolus 1,000 mL (0 mLs Intravenous Stopped 03/24/18 1611)  Tdap (BOOSTRIX) injection 0.5 mL (0.5 mLs Intramuscular Given 03/24/18 1507)     Initial Impression / Assessment and Plan / ED Course  I have reviewed the triage vital signs and the nursing  notes.  Pertinent labs & imaging results that were available during my care of the patient were reviewed by me and considered in my medical decision making (see chart for details).  Clinical Course as of Mar 25 1643  Mon Mar 24, 2018  1610 Patient's mother and her friend, a Engineer, civil (consulting)nurse, requested to speak with me.  I spoke with them without revealing any of patient's information, primarily listening to them.  They expressed their concerns that patient may have underlying mental health  problems causing him to have addiction problems.  Mom is tearful, and concerned.  I spoke with the patient, he denies SI, HI, or AVH.  Denies wanting to harm himself, stating that he had the seizure because he is trying to come off these drugs.  He is willing to be admitted for 1-2 days.     [EH]  1642 Spoke with hospitalist who will admit patient. Made her aware of patient request to not have any thing discussed in the presence of his mother and her friend.     [EH]    Clinical Course User Index [EH] Cristina GongHammond, Sheetal Lyall W, PA-C   Gordon Gonzalez presents today for evaluation of a witnessed seizure that lasted about 2 minutes.    He reports that he drinks a large bottle of vodka over the span of about 2 days.  In the past week he reports using marijuana, methamphetamine, alcohol, hallucinogens, and xanax.  He has only taken 2 pills of xanax and does not regularly use xanax so suspect seizure is secondary to alcohol withdrawal rather than benzo withdrawal.  CIWA performed 11 , patient given 2mg  ativan.    Labs obtained, no significant electrolyte abnormalities or disturbances.  He does have a slightly elevated total bilirubin at 1.7.  CT head, maxillofacial, and neck were all obtained without evidence of fracture or intracranial hemorrhage.  Tdap updated.  As patient has a seizure resulting from alcohol withdrawal I discussed patient with hospitalist for admission.  Patient does not meet IVC criteria by ED provider, despite requests by mother, he denies SI, AVH, HI, or depressive symptoms.  His seizure happened because he is trying to stop using drugs showing fair insight into his condition.    Hospitalist agreed to see and admit patient.    Final Clinical Impressions(s) / ED Diagnoses   Final diagnoses:  Seizure-like activity (HCC)  Alcohol dependence with withdrawal with complication [facility](HCC)  Polysubstance abuse [facility](HCC)    ED Discharge Orders    None       Norman ClayHammond, Daeton Kluth W,  PA-C 03/24/18 1650    Bethann BerkshireZammit, Joseph, MD 03/25/18 0730

## 2018-03-25 ENCOUNTER — Ambulatory Visit (HOSPITAL_COMMUNITY): Payer: Self-pay

## 2018-03-25 DIAGNOSIS — F19230 Other psychoactive substance dependence with withdrawal, uncomplicated: Secondary | ICD-10-CM

## 2018-03-25 DIAGNOSIS — H05232 Hemorrhage of left orbit: Secondary | ICD-10-CM

## 2018-03-25 DIAGNOSIS — F10239 Alcohol dependence with withdrawal, unspecified: Secondary | ICD-10-CM

## 2018-03-25 DIAGNOSIS — F19231 Other psychoactive substance dependence with withdrawal delirium: Secondary | ICD-10-CM

## 2018-03-25 DIAGNOSIS — F1910 Other psychoactive substance abuse, uncomplicated: Secondary | ICD-10-CM

## 2018-03-25 DIAGNOSIS — R569 Unspecified convulsions: Secondary | ICD-10-CM

## 2018-03-25 LAB — COMPREHENSIVE METABOLIC PANEL
ALT: 15 U/L — ABNORMAL LOW (ref 17–63)
AST: 22 U/L (ref 15–41)
Albumin: 3.8 g/dL (ref 3.5–5.0)
Alkaline Phosphatase: 45 U/L (ref 38–126)
Anion gap: 6 (ref 5–15)
BUN: 6 mg/dL (ref 6–20)
CO2: 23 mmol/L (ref 22–32)
Calcium: 8.7 mg/dL — ABNORMAL LOW (ref 8.9–10.3)
Chloride: 109 mmol/L (ref 101–111)
Creatinine, Ser: 0.84 mg/dL (ref 0.61–1.24)
GFR calc Af Amer: >60 mL/min (ref >60)
GFR calc non Af Amer: >60 mL/min (ref >60)
Glucose, Bld: 95 mg/dL (ref 65–99)
Potassium: 3.4 mmol/L — ABNORMAL LOW (ref 3.5–5.1)
Sodium: 138 mmol/L (ref 135–145)
Total Bilirubin: 1.2 mg/dL (ref 0.3–1.2)
Total Protein: 5.8 g/dL — ABNORMAL LOW (ref 6.5–8.1)

## 2018-03-25 LAB — HEPATITIS PANEL, ACUTE
HCV Ab: <0.1 s/co ratio (ref 0.0–0.9)
Hep A IgM: NEGATIVE
Hep B C IgM: NEGATIVE
Hepatitis B Surface Ag: NEGATIVE

## 2018-03-25 LAB — CBC WITH DIFFERENTIAL/PLATELET
Basophils Absolute: 0 10*3/uL (ref 0.0–0.1)
Basophils Relative: 0 %
Eosinophils Absolute: 0.1 10*3/uL (ref 0.0–0.7)
Eosinophils Relative: 1 %
HCT: 41.1 % (ref 39.0–52.0)
Hemoglobin: 14.3 g/dL (ref 13.0–17.0)
Lymphocytes Relative: 30 %
Lymphs Abs: 2.7 10*3/uL (ref 0.7–4.0)
MCH: 29.3 pg (ref 26.0–34.0)
MCHC: 34.8 g/dL (ref 30.0–36.0)
MCV: 84.2 fL (ref 78.0–100.0)
Monocytes Absolute: 0.6 10*3/uL (ref 0.1–1.0)
Monocytes Relative: 7 %
Neutro Abs: 5.5 10*3/uL (ref 1.7–7.7)
Neutrophils Relative %: 62 %
Platelets: 236 10*3/uL (ref 150–400)
RBC: 4.88 MIL/uL (ref 4.22–5.81)
RDW: 12.6 % (ref 11.5–15.5)
WBC: 8.9 10*3/uL (ref 4.0–10.5)

## 2018-03-25 LAB — PHOSPHORUS: Phosphorus: 4.4 mg/dL (ref 2.5–4.6)

## 2018-03-25 LAB — HIV ANTIBODY (ROUTINE TESTING W REFLEX): HIV Screen 4th Generation wRfx: NONREACTIVE

## 2018-03-25 MED ORDER — ACETAMINOPHEN 325 MG PO TABS: 650.0000 mg | ORAL_TABLET | Freq: Four times a day (QID) | ORAL | Status: DC | PRN

## 2018-03-25 MED ORDER — IBUPROFEN 400 MG PO TABS: 400.0000 mg | ORAL_TABLET | Freq: Four times a day (QID) | ORAL | Status: DC | PRN

## 2018-03-25 MED ORDER — LORAZEPAM 1 MG PO TABS
1.0000 mg | ORAL_TABLET | Freq: Once | ORAL | Status: AC
  Administered 2018-03-25: 1 mg via ORAL
  Filled 2018-03-25: qty 1

## 2018-03-25 MED ORDER — KCL IN DEXTROSE-NACL 20-5-0.9 MEQ/L-%-% IV SOLN
INTRAVENOUS | Status: DC
  Administered 2018-03-25: 13:00:00 via INTRAVENOUS
  Filled 2018-03-25: qty 1000

## 2018-03-25 MED ORDER — POTASSIUM CHLORIDE CRYS ER 20 MEQ PO TBCR
40.0000 meq | EXTENDED_RELEASE_TABLET | Freq: Once | ORAL | Status: AC
  Administered 2018-03-25: 40 meq via ORAL
  Filled 2018-03-25: qty 2

## 2018-03-25 NOTE — Discharge Instructions (Signed)
Chemical Dependency °Chemical dependency is an addiction to drugs or alcohol. People with this addiction repeatedly seek out and use drugs or alcohol despite negative consequences to the health and safety of themselves and others. °Addiction changes the way the brain works. Because of these changes, addiction is a chronic condition. The medical term for addiction or chemical dependency is substance use disorder. The disorder can be mild, moderate, or severe. °People can be dependent on a range of substances. These include alcohol, prescription medicines, and illegal or street drugs, such as marijuana, heroin, and cocaine. °What are the causes? °This condition is caused by the effect that the abused substance has on the brain. °What increases the risk? °The following factors may make you more likely to develop this condition: °· Having a family history of chemical dependency. °· Having mental health issues, such as depression, anxiety, or bipolar disorder. °· Living in an environment where drugs and alcohol are easily available. °· Using drugs or alcohol at a young age. °· Having friends who use drugs or alcohol. °· Having poor social skills. °· Tending to be aggressive or impulsive. ° °What are the signs or symptoms? °Symptoms may vary depending on the substance that you are addicted to. Symptoms may include the following: °Physical Symptoms °· The inability to limit the use of drugs or alcohol. °· Having nausea, sweating, shakiness, and anxiety when you are not using alcohol or drugs. °· Needing a greater amount of drugs or alcohol to get the same effect (developing tolerance). °· A change in: °? Sleeping habits. °? Eating or appetite. °? Appearance or how you care for yourself. °Emotional Symptoms °· Angry outbursts. °· Periods of sadness and tearfulness. °· Isolation. °Relationship Problems °· Loved ones suggesting that you have a problem. °· Increased fights. °· Forgetting commitments. °· Having affairs or  one-night stands. °Problems Related to Irresponsibility °· Legal problems. °· Irresponsibility with money. °· Missing work. °· Poor decision making. °How is this diagnosed? °This condition may be diagnosed based on your symptoms, your medical history, and a physical exam. You may also have blood tests and urine tests. °How is this treated? °Treatment for this condition depends on the substance that you are addicted to and whether your dependency is mild, moderate, or severe. Treatment options may include: °· Stopping substance use safely. This may require taking medicines and being closely observed for several days. °· Taking part in group and individual counseling with mental health providers who help people with chemical dependency. °· Staying at a residential treatment center for several days or weeks. °· Attending daily counseling sessions at a treatment center. °· Taking medicine as told by your health care provider to: °? Ease symptoms and prevent complications during withdrawal. °? Treat other mental health issues, such as depression or anxiety. °? Block cravings by causing the same effects as the substance. °? Block the effects of the substance or replace good sensations with unpleasant ones. °· Going to a support group to share your experience with others who are going through the same thing. These groups are an important part of long-term recovery for many people. They include 12-step programs like Alcoholics Anonymous and Narcotics Anonymous. ° °Recovery can be a long process. Many people who undergo treatment start using the substance again after stopping. This is called a relapse. If you have a relapse, it does not mean that treatment will not work. °Follow these instructions at home: °· Avoid temptations or triggers that you associate with your use of the   substance.  Learn and practice techniques for managing stress.  Have a plan for vulnerable moments.  Get phone numbers of those who are willing  to help and who are committed to your recovery.  Know when and where the meetings that you have chosen will occur.  Take over-the-counter and prescription medicines only as told by your health care provider.  Keep all follow-up visits as told by your health care provider. This is important. Contact a health care provider if:  You cannot take your medicines as told.  Your symptoms get worse.  You have trouble resisting the urge to use drugs or alcohol.  You are in pain, shaking, sweating, or feeling generally unwell.  You are losing weight without trying to. Get help right away if:  You lose consciousness.  Your breathing is slow.  Your pulse is slow or jumpy.  You have serious thoughts about hurting yourself or someone else.  You have a relapse. This information is not intended to replace advice given to you by your health care provider. Make sure you discuss any questions you have with your health care provider. Document Released: 11/13/2001 Document Revised: 12/26/2015 Document Reviewed: 07/27/2015 Elsevier Interactive Patient Education  [employer]2018 Elsevier Inc[employer]

## 2018-03-25 NOTE — Progress Notes (Signed)
Spoke with patients mom, stated OK for Girlfriend to transport patient home and she would transport him to rehab facility once he arrived home. [place]. Carlton Sweaney, Randall AnKristin Jessup RN

## 2018-03-25 NOTE — Progress Notes (Signed)
[place] TEAM 1 - Stepdown/ICU TEAM  Gordon Gonzalez  ZOX:[id number] DOB: 1996-03-28 DOA: 03/24/2018 PCP: Default, Provider, MD    Brief Narrative:  22yo M w/ a hx of polysubstance abuse who lately had been mainly using Xanax daily up to 6 mg daily and a substance that he has obtained via the Internet called "Phenibut".  He also reported cycling hallucinogens, stimulants, weed, and other misc substances.  His last drug use was on the morning of 03/22/18 when he decided to quit cold [place].  Since that time he has experienced decreased appetite, lack of sleep, feeling hot and cold.  The morning of his admission he suffered a tonic/clonic type convulsive event, during which he fell to the ground, but did not loose control of his bowels or bladder. After this her reported to the ED, and an ativan CIWA protocol was begun.    Significant Events: 4/22 admit   Subjective: Pt is calm and interactive today.  He is alert and oriented.  He reports he feels a low level agitation and anxiety, but denies tremors, HA, N/V, chest pain, sob, or abdom pain.  He is interested in being transferred directly from [facility] to a residential drug rehab facility.    Assessment & Plan:  Withdrawal seizure Due to acute withdrawal from benzodiazepines +/- Phenibut (CNS depressant with anxiolytic and stimulant effects) +/- alcohol - medically stable at this time - if rehab facility of his choice is able to provide medical tx for withdrawal sx, he could be D/C as soon as a bed is confirmed - he has thus far received a total of 4mg  of ativan today   Polysubstance abuse UDS positive for amphetamines, benzodiazepines and THC - cessation counseled - patient is already established w/ a substance abuse counselor who is assisting him in obtaining placement in a residential rehab facility   Small left periorbital and malar soft tissue hematomas Noted on CT maxillofacial - CT head and neck without acute findings - tetanus status  updated in ED - stable at this time   DVT prophylaxis: SCDs Code Status: FULL CODE Family Communication: no family present at time of exam  Disposition Plan: await procurement of bed in a residential rehab facility   Consultants:  None   Antimicrobials:  none  Objective: Blood pressure 111/71, pulse 84, temperature 98.6 F (37 C), temperature source Oral, resp. rate 18, height 5\' 9"  (1.753 m), weight 61.8 kg (136 lb 4.8 oz), SpO2 100 %.  Intake/Output Summary (Last 24 hours) at 03/25/2018 1432 Last data filed at 03/25/2018 0411 Gross per 24 hour  Intake 1136.25 ml  Output -  Net 1136.25 ml   Filed Weights   03/24/18 1400 03/24/18 2221  Weight: 79.4 kg (175 lb) 61.8 kg (136 lb 4.8 oz)    Examination: General: No acute respiratory distress Lungs: Clear to auscultation bilaterally without wheezes or crackles Cardiovascular: Regular rate and rhythm without murmur gallop or rub normal S1 and S2 Abdomen: Nontender, nondistended, soft, bowel sounds positive, no rebound, no ascites, no appreciable mass Extremities: No significant cyanosis, clubbing, or edema bilateral lower extremities  CBC: Recent Labs  Lab 03/24/18 1504 03/25/18 0341  WBC 9.0 8.9  NEUTROABS 7.9* 5.5  HGB 15.2 14.3  HCT 42.5 41.1  MCV 82.5 84.2  PLT 235 236   Basic Metabolic Panel: Recent Labs  Lab 03/24/18 1504 03/25/18 0341  NA 137 138  K 3.7 3.4*  CL 106 109  CO2 21* 23  GLUCOSE 103* 95  BUN 5* 6  CREATININE 0.90 0.84  CALCIUM 9.0 8.7*  MG 2.5*  --   PHOS 1.3* 4.4   GFR: Estimated Creatinine Clearance: 120.6 mL/min (by C-G formula based on SCr of 0.84 mg/dL).  Liver Function Tests: Recent Labs  Lab 03/24/18 1504 03/25/18 0341  AST 28 22  ALT 18 15*  ALKPHOS 47 45  BILITOT 1.7* 1.2  PROT 6.5 5.8*  ALBUMIN 4.4 3.8    Scheduled Meds: . feeding supplement (ENSURE ENLIVE)  237 mL Oral BID BM  . neomycin-bacitracin-polymyxin   Topical Daily  . nicotine  14 mg Transdermal Daily    . sodium chloride flush  3 mL Intravenous Q12H     LOS: 1 day   Lonia BloodJeffrey T. Kota Ciancio, MD [facility] Office  [phone] Pager - Text Page per Amion as per below:  On-Call/Text Page:      [email or web address]      password [email or web address]  If 7PM-7AM, please contact night-coverage [email or web address] Password [email or web address] 03/25/2018, 2:32 PM

## 2018-03-25 NOTE — Progress Notes (Signed)
Initial Nutrition Assessment  DOCUMENTATION CODES:   Not applicable  INTERVENTION:  Continue Ensure BID RD to monitor PO intake  NUTRITION DIAGNOSIS:   Inadequate oral intake related to social / environmental circumstances, decreased appetite(substance abuse) as evidenced by per patient/family report.  GOAL:   Patient will meet greater than or equal to 90% of their needs  MONITOR:   PO intake, Supplement acceptance, Labs, I & O's, Skin, Weight trends  REASON FOR ASSESSMENT:   Malnutrition Screening Tool    ASSESSMENT:   22 y.o. M admitted 03/24/18 for seizures. Pt has polysubstance abuse and  Hematomas. PMH of seizures.   Pt reports not eating or sleeping for 6 months due to polysubstance abuse. Pt reports he weight 176 lbs prior to losing weight; suspect reported weight may be inaccurate due to lack of muscle/fat wasting and last known recorded weight of 138 lbs in 2015. Pt couldn't provide detailed nutrition history regarding types of food, frequency of meals, or amount at meals. Pt reports for breakfast he ate some fruit and that he drank the entire ensure.   Medications reviewed: Ensure Enlive BID  Labs reviewed: K+ 3.4 (L), ALT 15 (L), total protein 5.8 (L).    Intake/Output Summary (Last 24 hours) at 03/25/2018 1408 Last data filed at 03/25/2018 0411 Gross per 24 hour  Intake 1136.25 ml  Output -  Net 1136.25 ml    NUTRITION - FOCUSED PHYSICAL EXAM:    Most Recent Value  Orbital Region  No depletion  Upper Arm Region  No depletion  Thoracic and Lumbar Region  Mild depletion  Buccal Region  No depletion  Temple Region  No depletion  Clavicle Bone Region  Mild depletion  Clavicle and Acromion Bone Region  No depletion  Scapular Bone Region  No depletion  Dorsal Hand  No depletion  Patellar Region  No depletion  Anterior Thigh Region  No depletion  Posterior Calf Region  No depletion  Edema (RD Assessment)  None  Hair  Reviewed  Eyes  Reviewed  Mouth   Reviewed  Skin  Reviewed  Nails  Reviewed       Diet Order:  Seizure precautions Diet regular Room service appropriate? Yes; Fluid consistency: Thin  EDUCATION NEEDS:   No education needs have been identified at this time  Skin:  Skin Assessment: Skin Integrity Issues: Skin Integrity Issues:: Other (Comment) Other: skin tear; R/L knee  Last BM:  03/23/18  Height:   Ht Readings from Last 1 Encounters:  03/24/18 5\' 9"  (1.753 m)    Weight:   Wt Readings from Last 1 Encounters:  03/24/18 136 lb 4.8 oz (61.8 kg)   UBW: 176 lbs (potentially self reported) %UBW: 77%: 23% loss since 06/2016 - not significant.   Ideal Body Weight:  72.72 kg  BMI:  Body mass index is 20.13 kg/m.  Estimated Nutritional Needs:   Kcal:  1700-1900 kcal  Protein:  60-70 grams  Fluid:  >/= 1.7 L   Gordon Gonzalez, Dietetic Intern

## 2018-03-25 NOTE — Progress Notes (Addendum)
Patient in a stable condition, discharge instructions reviewed with patient and his girlfriend at bedside, they verbalized understanding, iv removed, tele dc ccmd notified, patient belongings at bedside, patient's mother spoke with the primary nurse on the phone and said it was alright for the patient to be transported home  by his girlfriend Gordon Gonzalez and then the patient's mother  would transport patient from home  to the rehab. Patient offered a wheelchair to the main entrance, he refused and said he would rather walk. Patient left the unit accompanied by his girl friend.

## 2018-03-25 NOTE — Discharge Summary (Signed)
DISCHARGE SUMMARY  Gordon Gonzalez  MR#: [id number]  DOB:11/18/1996  Date of Admission: 03/24/2018 Date of Discharge: 03/25/2018  Attending Physician:Lumi Winslett Silvestre Gunner, MD   Patient's Gordon Gonzalez, Provider, MD  Consults:  none  Disposition: D/C to allow voluntary admittance to residential drug rehab facility in [place], [place]  Discharge Diagnoses: Withdrawal seizure Polysubstance abuse Small left periorbital and malar soft tissue hematomas  Initial presentation: 22yo M w/ a hx of polysubstance abuse who lately had been mainly using Xanax daily up to 6 mg daily and a substance that he has obtained via the Internet called "Phenibut".  He also reported cycling hallucinogens, stimulants, weed, and other misc substances. His last drug use was on the morning of 03/22/18 when he decided to quit cold [place]. Since that time he has experienced decreased appetite, lack of sleep, feeling hot and cold. The morning of his admission he suffered a tonic/clonic type convulsive event, during which he fell to the ground, but did not loose control of his bowels or bladder. After this her reported to the ED, and an ativan CIWA protocol was begun.    Hospital Course: The patient was admitted to the acute units after the above-noted seizure.  Investigation suggested that the seizure activity was likely related to benzodiazepine withdrawal.  The patient admitted to abusing many substances but the lion share of his abuse appeared to be benzodiazepines.  He was already connected with a substance abuse counselor at his [employer] who also visited him during his hospital stay.  He was treated with the CIWA Ativan protocol and tolerated this well.  By the second day of his hospital stay the patient was alert oriented and conversant.  There was no agitation or evidence of ongoing withdrawal with the use of Ativan as per the protocol mentioned above.  With the assistance of the patient's outpatient substance abuse  counselor the patient acquired a bed for residential substance abuse rehabilitation.  The patient requested discharge from the hospital so that he could be admitted to said facility for long-term treatment.  The patient was felt to be medically safe to do so.  He was given a one-time 1 mg oral Ativan dose prior to discharge to assure recurrence of his withdrawal did not occur during the trip to his facility.  Allergies as of 03/25/2018      Reactions   Wellbutrin [bupropion] Hives, Swelling      Medication List    TAKE these medications   levothyroxine 25 MCG tablet Commonly known as:  SYNTHROID, LEVOTHROID Take 25 mcg by mouth daily before breakfast.       Day of Discharge BP 111/71 (BP Location: Right Arm)   Pulse 84   Temp 98.6 F (37 C) (Oral)   Resp 18   Ht 5\' 9"  (1.753 m)   Wt 61.8 kg (136 lb 4.8 oz)   SpO2 100%   BMI 20.13 kg/m   Physical Exam: General: No acute respiratory distress Lungs: Clear to auscultation bilaterally without wheezes or crackles Cardiovascular: Regular rate and rhythm without murmur gallop or rub normal S1 and S2 Abdomen: Nontender, nondistended, soft, bowel sounds positive, no rebound, no ascites, no appreciable mass Extremities: No significant cyanosis, clubbing, or edema bilateral lower extremities  Basic Metabolic Panel: Recent Labs  Lab 03/24/18 1504 03/25/18 0341  NA 137 138  K 3.7 3.4*  CL 106 109  CO2 21* 23  GLUCOSE 103* 95  BUN 5* 6  CREATININE 0.90 0.84  CALCIUM 9.0 8.7*  MG 2.5*  --   PHOS 1.3* 4.4    Liver Function Tests: Recent Labs  Lab 03/24/18 1504 03/25/18 0341  AST 28 22  ALT 18 15*  ALKPHOS 47 45  BILITOT 1.7* 1.2  PROT 6.5 5.8*  ALBUMIN 4.4 3.8   CBC: Recent Labs  Lab 03/24/18 1504 03/25/18 0341  WBC 9.0 8.9  NEUTROABS 7.9* 5.5  HGB 15.2 14.3  HCT 42.5 41.1  MCV 82.5 84.2  PLT 235 236    Time spent in discharge (includes decision making & examination of pt): 30 minutes  03/25/2018, 6:27 PM    Lonia BloodJeffrey T. Lafern Brinkley, MD [facility] Office  [phone] Pager [phone]  On-Call/Text Page:      [email or web address]      password [email or web address]

## 2018-04-24 ENCOUNTER — Ambulatory Visit (HOSPITAL_COMMUNITY): Payer: Self-pay

## 2018-04-24 NOTE — Patient Outreach (Signed)
CPSS ran into patient at a NA meeting on Saturday night 04/19/18 at 9:00pm in [place] and followed up with him. Patient is doing well and attending NA meetings regularly in [place] after leaving [facility] residential treatment in [place]. CPSS plans to still stay in contact with the patient to provide substance use recovery support.

## 2018-04-24 NOTE — Patient Outreach (Deleted)
CPSS ran into patient at a NA meeting on Saturday night 9:00pm in [place] and follow up with hime. Patient is doing well and attending NA meetings after leaving [facility] treatment

## 2020-09-06 ENCOUNTER — Other Ambulatory Visit: Payer: Self-pay

## 2020-09-06 ENCOUNTER — Encounter (HOSPITAL_COMMUNITY): Payer: Self-pay

## 2020-09-06 ENCOUNTER — Emergency Department (HOSPITAL_COMMUNITY)
Admission: EM | Admit: 2020-09-06 | Discharge: 2020-09-07 | Disposition: A | Discharge status: Other Acute Inpatient Hospital | Payer: Managed Care, Other (non HMO) | Attending: Emergency Medicine | Admitting: Emergency Medicine

## 2020-09-06 DIAGNOSIS — Z20822 Contact with and (suspected) exposure to covid-19: Secondary | ICD-10-CM | POA: Diagnosis not present

## 2020-09-06 DIAGNOSIS — R45851 Suicidal ideations: Secondary | ICD-10-CM | POA: Insufficient documentation

## 2020-09-06 DIAGNOSIS — F1910 Other psychoactive substance abuse, uncomplicated: Secondary | ICD-10-CM | POA: Diagnosis present

## 2020-09-06 DIAGNOSIS — Z79899 Other long term (current) drug therapy: Secondary | ICD-10-CM | POA: Diagnosis not present

## 2020-09-06 DIAGNOSIS — Z046 Encounter for general psychiatric examination, requested by authority: Secondary | ICD-10-CM | POA: Diagnosis present

## 2020-09-06 DIAGNOSIS — E039 Hypothyroidism, unspecified: Secondary | ICD-10-CM | POA: Diagnosis not present

## 2020-09-06 LAB — CBC WITH DIFFERENTIAL/PLATELET
Abs Immature Granulocytes: 0.01 10*3/uL (ref 0.00–0.07)
Basophils Absolute: 0.1 10*3/uL (ref 0.0–0.1)
Basophils Relative: 1 %
Eosinophils Absolute: 0.2 10*3/uL (ref 0.0–0.5)
Eosinophils Relative: 3 %
HCT: 45.3 % (ref 39.0–52.0)
Hemoglobin: 15.9 g/dL (ref 13.0–17.0)
Immature Granulocytes: 0 %
Lymphocytes Relative: 20 %
Lymphs Abs: 1.5 10*3/uL (ref 0.7–4.0)
MCH: 29.5 pg (ref 26.0–34.0)
MCHC: 35.1 g/dL (ref 30.0–36.0)
MCV: 84 fL (ref 80.0–100.0)
Monocytes Absolute: 0.3 10*3/uL (ref 0.1–1.0)
Monocytes Relative: 4 %
Neutro Abs: 5.2 10*3/uL (ref 1.7–7.7)
Neutrophils Relative %: 72 %
Platelets: 245 10*3/uL (ref 150–400)
RBC: 5.39 MIL/uL (ref 4.22–5.81)
RDW: 12.7 % (ref 11.5–15.5)
WBC: 7.2 10*3/uL (ref 4.0–10.5)
nRBC: 0 % (ref 0.0–0.2)

## 2020-09-06 LAB — COMPREHENSIVE METABOLIC PANEL
ALT: 20 U/L (ref 0–44)
AST: 27 U/L (ref 15–41)
Albumin: 4.7 g/dL (ref 3.5–5.0)
Alkaline Phosphatase: 46 U/L (ref 38–126)
Anion gap: 11 (ref 5–15)
BUN: 9 mg/dL (ref 6–20)
CO2: 27 mmol/L (ref 22–32)
Calcium: 9.7 mg/dL (ref 8.9–10.3)
Chloride: 101 mmol/L (ref 98–111)
Creatinine, Ser: 0.95 mg/dL (ref 0.61–1.24)
GFR calc non Af Amer: >60 mL/min (ref >60)
Glucose, Bld: 119 mg/dL — ABNORMAL HIGH (ref 70–99)
Potassium: 3.7 mmol/L (ref 3.5–5.1)
Sodium: 139 mmol/L (ref 135–145)
Total Bilirubin: 2.1 mg/dL — ABNORMAL HIGH (ref 0.3–1.2)
Total Protein: 7.5 g/dL (ref 6.5–8.1)

## 2020-09-06 LAB — ETHANOL: Alcohol, Ethyl (B): <10 mg/dL (ref <10)

## 2020-09-06 LAB — ACETAMINOPHEN LEVEL: Acetaminophen (Tylenol), Serum: <10 ug/mL — ABNORMAL LOW (ref 10–30)

## 2020-09-06 LAB — RESPIRATORY PANEL BY RT PCR (FLU A&B, COVID)
Influenza A by PCR: NEGATIVE
Influenza B by PCR: NEGATIVE
SARS Coronavirus 2 by RT PCR: NEGATIVE

## 2020-09-06 LAB — RAPID URINE DRUG SCREEN, HOSP PERFORMED
Amphetamines: POSITIVE — AB
Barbiturates: NOT DETECTED
Benzodiazepines: POSITIVE — AB
Cocaine: POSITIVE — AB
Opiates: NOT DETECTED
Tetrahydrocannabinol: POSITIVE — AB

## 2020-09-06 LAB — SALICYLATE LEVEL: Salicylate Lvl: <7 mg/dL — ABNORMAL LOW (ref 7.0–30.0)

## 2020-09-06 MED ORDER — ALUM & MAG HYDROXIDE-SIMETH 200-200-20 MG/5ML PO SUSP: 30.0000 mL | Freq: Four times a day (QID) | ORAL | Status: DC | PRN

## 2020-09-06 MED ORDER — ONDANSETRON HCL 4 MG PO TABS: 4.0000 mg | ORAL_TABLET | Freq: Three times a day (TID) | ORAL | Status: DC | PRN

## 2020-09-06 MED ORDER — ACETAMINOPHEN 325 MG PO TABS: 650.0000 mg | ORAL_TABLET | ORAL | Status: DC | PRN

## 2020-09-06 NOTE — ED Provider Notes (Signed)
[facility]-EMERGENC[facility] DEPT Provider Note   CSN: [id number] Arrival date & time: 09/06/20  2018     History Chief Complaint  Patient presents with  . IVC  . Suicidal    Gordon Gonzalez is a 24 y.o. male.  He has a history of hypothyroidism.  He says he abuses alcohol and sometimes takes pills.  He is here under an IVC after he allegedly told his sister that he was thinking about suicide.  He says he is relapsed with alcohol and drugs.  He denies any medical complaints.  He also denies being suicidal currently.  He wants to be released so he can go back to NA and his sponsor.  The history is provided by the patient.  Mental Health Problem Presenting symptoms: suicidal threats   Patient accompanied by:  Law enforcement Degree of incapacity (severity):  Unable to specify Onset quality:  Unable to specify Timing:  Unable to specify Progression:  Resolved Context: alcohol use, drug abuse and stressful life event   Treatment compliance:  Unable to specify Relieved by:  None tried Worsened by:  Alcohol and drugs Ineffective treatments:  None tried Associated symptoms: no abdominal pain, no chest pain and no headaches        Past Medical History:  Diagnosis Date  . Seizures [facility])     Patient Active Problem List   Diagnosis Date Noted  . Polysubstance abuse (HCC) 03/24/2018  . Alcohol dependence with withdrawal with complication (HCC) 03/24/2018  . Periorbital hematoma of left eye 03/24/2018  . Multiple abrasions 03/24/2018  . Opioid abuse (HCC) 08/29/2014  . Unspecified constipation 08/29/2014  . Admitted to substance misuse detoxification center 08/29/2014  . Colonic obstruction (HCC) 08/28/2014    History reviewed. No pertinent surgical history.     History reviewed. No pertinent family history.  Social History   Tobacco Use  . Smoking status: Never Smoker  . Smokeless tobacco: Never Used  Substance Use Topics  . Alcohol use: Yes  . Drug  use: Yes    Types: Cocaine    Comment: Opiates    Home Medications Prior to Admission medications   Medication Sig Start Date End Date Taking? Authorizing Provider  levothyroxine (SYNTHROID, LEVOTHROID) 25 MCG tablet Take 25 mcg by mouth daily before breakfast.    [provider]    Allergies    Wellbutrin [bupropion]  Review of Systems   Review of Systems  Constitutional: Negative for fever.  HENT: Negative for sore throat.   Eyes: Negative for visual disturbance.  Respiratory: Negative for shortness of breath.   Cardiovascular: Negative for chest pain.  Gastrointestinal: Negative for abdominal pain.  Genitourinary: Negative for dysuria.  Musculoskeletal: Negative for gait problem.  Skin: Negative for rash.  Neurological: Negative for headaches.    Physical Exam Updated Vital Signs BP 122/89 (BP Location: Right Arm)   Pulse (!) 109   Temp 98.1 F (36.7 C) (Oral)   Resp 18   Ht 5\' 8"  (1.727 m)   Wt 63.5 kg   SpO2 95%   BMI 21.29 kg/m   Physical Exam Vitals and nursing note reviewed.  Constitutional:      Appearance: Normal appearance. He is well-developed.  HENT:     Head: Normocephalic and atraumatic.  Eyes:     Conjunctiva/sclera: Conjunctivae normal.  Cardiovascular:     Rate and Rhythm: Normal rate and regular rhythm.     Heart sounds: No murmur heard.   Pulmonary:  Effort: Pulmonary effort is normal. No respiratory distress.     Breath sounds: Normal breath sounds.  Abdominal:     Palpations: Abdomen is soft.     Tenderness: There is no abdominal tenderness.  Musculoskeletal:        General: Normal range of motion.     Cervical back: Neck supple.  Skin:    General: Skin is warm and dry.     Capillary Refill: Capillary refill takes less than 2 seconds.  Neurological:     General: No focal deficit present.     Mental Status: He is alert.     ED Results / Procedures / Treatments   Labs (all labs ordered are listed, but only  abnormal results are displayed) Labs Reviewed  SALICYLATE LEVEL - Abnormal; Notable for the following components:      Result Value   Salicylate Lvl <7.0 (*)    All other components within normal limits  ACETAMINOPHEN LEVEL - Abnormal; Notable for the following components:   Acetaminophen (Tylenol), Serum <10 (*)    All other components within normal limits  COMPREHENSIVE METABOLIC PANEL - Abnormal; Notable for the following components:   Glucose, Bld 119 (*)    Total Bilirubin 2.1 (*)    All other components within normal limits  RAPID URINE DRUG SCREEN, HOSP PERFORMED - Abnormal; Notable for the following components:   Cocaine POSITIVE (*)    Benzodiazepines POSITIVE (*)    Amphetamines POSITIVE (*)    Tetrahydrocannabinol POSITIVE (*)    All other components within normal limits  RESPIRATORY PANEL BY RT PCR (FLU A&B, COVID)  ETHANOL  CBC WITH DIFFERENTIAL/PLATELET    EKG EKG Interpretation  Date/Time:  Tuesday September 06 2020 20:53:19 EDT Ventricular Rate:  111 PR Interval:    QRS Duration: 94 QT Interval:  327 QTC Calculation: 445 R Axis:   92 Text Interpretation: Sinus tachycardia Borderline right axis deviation ST elev, probable normal early repol pattern Baseline wander in lead(s) V6 12 Lead; [place] Confirmed by Pricilla Loveless [phone]) on 09/07/2020 8:29:15 AM   Radiology No results found.  Procedures Procedures (including critical care time)  Medications Ordered in ED Medications  acetaminophen (TYLENOL) tablet 650 mg (has no administration in time range)  ondansetron (ZOFRAN) tablet 4 mg (has no administration in time range)  alum & mag hydroxide-simeth (MAALOX/MYLANTA) 200-200-20 MG/5ML suspension 30 mL (has no administration in time range)  nicotine (NICODERM CQ - dosed in mg/24 hours) patch 21 mg (21 mg Transdermal Patch Applied 09/07/20 0321)    ED Course  I have reviewed the triage vital signs and the nursing notes.  Pertinent labs & imaging  results that were available during my care of the patient were reviewed by me and considered in my medical decision making (see chart for details).  Clinical Course as of Sep 07 1021  Tue Sep 06, 2020  2121 EKG showing sinus tachycardia rate of 110 ST elevation consistent with early repole normal QTC.   [MB]    Clinical Course User Index [MB] Terrilee Files, MD   MDM Rules/Calculators/A&P                         The patient has been placed in psychiatric observation due to the need to provide a safe environment for the patient while obtaining psychiatric consultation and evaluation, as well as ongoing medical and medication management to treat the patient's condition.  The patient has been placed under full  IVC at this time.  I will fill out the first exam so the patient can have a TTS evaluation.  Final Clinical Impression(s) / ED Diagnoses Final diagnoses:  Suicidal ideations  Polysubstance abuse [facility])    Rx / DC Orders ED Discharge Orders    None       Terrilee Files, MD 09/07/20 1023

## 2020-09-06 NOTE — ED Triage Notes (Signed)
PT BIB [facility]. Pt reportedly told his sister that he has been having suicidal thoughts due to not being able to see his daughter. Pts mother IVC'd patient. Pt reports he would overdose on medication.

## 2020-09-07 ENCOUNTER — Ambulatory Visit (HOSPITAL_COMMUNITY)
Admission: EM | Admit: 2020-09-07 | Discharge: 2020-09-08 | Disposition: A | Discharge status: Home / Self Care | Payer: Managed Care, Other (non HMO) | Attending: Psychiatric/Mental Health | Admitting: Psychiatric/Mental Health

## 2020-09-07 DIAGNOSIS — F1910 Other psychoactive substance abuse, uncomplicated: Secondary | ICD-10-CM

## 2020-09-07 MED ORDER — ACETAMINOPHEN 325 MG PO TABS: 650.0000 mg | ORAL_TABLET | Freq: Four times a day (QID) | ORAL | Status: DC | PRN

## 2020-09-07 MED ORDER — ONDANSETRON 4 MG PO TBDP: 4.0000 mg | ORAL_TABLET | Freq: Four times a day (QID) | ORAL | Status: DC | PRN

## 2020-09-07 MED ORDER — LORAZEPAM 1 MG PO TABS
1.0000 mg | ORAL_TABLET | Freq: Four times a day (QID) | ORAL | Status: DC
  Administered 2020-09-07 – 2020-09-08 (×3): 1 mg via ORAL
  Filled 2020-09-07 (×3): qty 1

## 2020-09-07 MED ORDER — LORAZEPAM 1 MG PO TABS: 1.0000 mg | ORAL_TABLET | Freq: Three times a day (TID) | ORAL | Status: DC

## 2020-09-07 MED ORDER — ALUM & MAG HYDROXIDE-SIMETH 200-200-20 MG/5ML PO SUSP: 30.0000 mL | ORAL | Status: DC | PRN

## 2020-09-07 MED ORDER — LORAZEPAM 1 MG PO TABS
1.0000 mg | ORAL_TABLET | Freq: Four times a day (QID) | ORAL | Status: DC | PRN
  Administered 2020-09-07: 1 mg via ORAL
  Filled 2020-09-07: qty 1

## 2020-09-07 MED ORDER — LOPERAMIDE HCL 2 MG PO CAPS: 2.0000 mg | ORAL_CAPSULE | ORAL | Status: DC | PRN

## 2020-09-07 MED ORDER — LORAZEPAM 1 MG PO TABS: 1.0000 mg | ORAL_TABLET | Freq: Two times a day (BID) | ORAL | Status: DC

## 2020-09-07 MED ORDER — THIAMINE HCL 100 MG PO TABS
100.0000 mg | ORAL_TABLET | Freq: Every day | ORAL | Status: DC
  Administered 2020-09-08: 100 mg via ORAL
  Filled 2020-09-07: qty 1

## 2020-09-07 MED ORDER — HYDROXYZINE HCL 25 MG PO TABS: 25.0000 mg | ORAL_TABLET | Freq: Three times a day (TID) | ORAL | Status: DC | PRN

## 2020-09-07 MED ORDER — THIAMINE HCL 100 MG PO TABS: 100.0000 mg | ORAL_TABLET | Freq: Every day | ORAL | Status: DC

## 2020-09-07 MED ORDER — MAGNESIUM HYDROXIDE 400 MG/5ML PO SUSP: 30.0000 mL | Freq: Every day | ORAL | Status: DC | PRN

## 2020-09-07 MED ORDER — ADULT MULTIVITAMIN W/MINERALS CH
1.0000 | ORAL_TABLET | Freq: Every day | ORAL | Status: DC
  Administered 2020-09-07 – 2020-09-08 (×2): 1 via ORAL
  Filled 2020-09-07 (×2): qty 1

## 2020-09-07 MED ORDER — LORAZEPAM 1 MG PO TABS: 1.0000 mg | ORAL_TABLET | Freq: Every day | ORAL | Status: DC

## 2020-09-07 MED ORDER — THIAMINE HCL 100 MG/ML IJ SOLN: 100.0000 mg | Freq: Once | INTRAMUSCULAR | Status: DC

## 2020-09-07 MED ORDER — LORAZEPAM 1 MG PO TABS: 1.0000 mg | ORAL_TABLET | Freq: Four times a day (QID) | ORAL | Status: DC | PRN

## 2020-09-07 MED ORDER — TRAZODONE HCL 50 MG PO TABS
50.0000 mg | ORAL_TABLET | Freq: Every evening | ORAL | Status: DC | PRN
  Administered 2020-09-07: 50 mg via ORAL
  Filled 2020-09-07: qty 1

## 2020-09-07 MED ORDER — ADULT MULTIVITAMIN W/MINERALS CH: 1.0000 | ORAL_TABLET | Freq: Every day | ORAL | Status: DC

## 2020-09-07 MED ORDER — NICOTINE 21 MG/24HR TD PT24
21.0000 mg | MEDICATED_PATCH | Freq: Once | TRANSDERMAL | Status: DC
  Administered 2020-09-07: 21 mg via TRANSDERMAL
  Filled 2020-09-07: qty 1

## 2020-09-07 MED ORDER — HYDROXYZINE HCL 25 MG PO TABS: 25.0000 mg | ORAL_TABLET | Freq: Four times a day (QID) | ORAL | Status: DC | PRN

## 2020-09-07 NOTE — ED Notes (Signed)
Pt ambulated per self on unit. A&Ox4. Oriented to unit and staff. No new issues noted. Will continue to monitor for safety

## 2020-09-07 NOTE — Consult Note (Signed)
[name], 24 y.o., male patient seen via tele psych by this provider, Maxie Barb, NP; and chart reviewed with Dr. Nelly Rout on 09/07/20.  On evaluation Gordon Gonzalez reports recent drug and alcohol relapse. Pt states he "used substances for 36 hours and the substances made me act different". Pt states he "ate MDMA pills, Xanax bars, cocaine, and Delta 8 (THC)".   During evaluation QUANTAVIS OBRYANT is alert/oriented x 3 (unsure of exact day of the week); cooperative; and mood is congruent with affect. He does not appear to be responding to internal/external stimuli or delusional thoughts.  Patient denies suicidal/self-harm/homicidal ideation, psychosis, and paranoia.  Patient tangential and circumstantial at times when discussing his family situation, however answered question appropriately. Pt provided mother's information and permission to obtain collateral information.   Collateral information obtained from patient's mother [name] [phone]. Pt's mother expressed concern regarding son's mental decline and chronic substance abuse. Pt's mother states pt initially relapsed in April of this year and his fiance/child's mother left the patient, stating he has been on a "steady decline" since. She states patient has endorsed to both her and his sister self harm behaviors (banging head on wall, cut his arm) and passive suicide statements ("has drugs to kill himself") several times over the past few weeks, mom and patient have both denied any active plan at this time. Mom states the family and a prior therapist believe the patient has underlying bipolar disorder, however patient has never followed through to obtain an actual diagnosis. 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 Due to recent decline in mental health and grades, patient's mom states upon discharge patient will return home to live with parents.   Treatment Plan Summary: Provider  discussed with patient's mother safety plan and IVC criteria. Mom states pt has outpatient therapist (last appointment unknown) and the family is willing to pay for treatment.   Case reviewed with attending physician Dr. Nelly Rout. Pt shows no acute risk to harm himself or others at this time Patient does not meet involuntary commitment criteria at this time. Pt agrees to remain on 23 hour observation and discharge with outpatient substance abuse services such as [facility]) and to follow up with outpatient therapy services with his therapist.

## 2020-09-07 NOTE — ED Notes (Signed)
Pt resting comfortably at present, no distress noted, calm & cooperative.  Monitoring for safety.

## 2020-09-07 NOTE — ED Notes (Signed)
Pt A&O x 4, sleeping at present, no distress noted, monitoring for safety. 

## 2020-09-07 NOTE — ED Notes (Signed)
[facility] called and notified of patient transport, report given. Safe transport called for transport.

## 2020-09-07 NOTE — ED Provider Notes (Signed)
Patient is not suicidal at this time.  Psychiatry is recommending transferring him to behavioral health urgent care.  We have discussed this and he is agreeable to the transfer. Maxie Barb, NP is accepting   Pricilla Loveless, MD 09/07/20 1414

## 2020-09-07 NOTE — BHH Counselor (Signed)
This counselor spoke with patient's sister, Malvin Johns, at [phone]. She states that she is concerned about patient being discharged. She states that she just got off the phone with him and he was yelling at her and her mother for putting him in this situation. This counselor provided validation and support. Patient notified her that he would be coming to [facility] tonight.

## 2020-09-07 NOTE — BH Assessment (Addendum)
[facility] Assessment Progress Note  Per Maxie Barb, NP, pt is to be is transferred to the [facility].  [facility] staff agree to accept pt.  Pt presents under IVC initiated by pt's mother and initially upheld by EDP Meridee Score, MD which has been rescinded by Nelly Rout, MD.  Please call report to [phone].  Pt is to be transported via [employer].  EDP Pricilla Loveless, MD and pt's nurse have been notified.  Doylene Canning, [place] Behavioral Health Coordinator [phone]

## 2020-09-07 NOTE — BH Assessment (Signed)
Comprehensive Clinical Assessment (CCA) Note  09/07/2020 THOMA PAULSEN [id number]    Per EDP report, "Gordon Gonzalez is a 24 y.o. male.  He has a history of hypothyroidism.  He says he abuses alcohol and sometimes takes pills.  He is here under an IVC after he allegedly told his sister that he was thinking about suicide.  He says he is relapsed with alcohol and drugs.  He denies any medical complaints.  He also denies being suicidal currently.  He wants to be released so he can go back to NA and his sponsor."   During assessment pt presented anxious but cooperative. Pt states that he has a substance abuse problem and that he relapsed on alcohol and pills. Pt reports he does not know what type of pills he took, states he think he took benzos. Pt states he has been to rehab numerous times  And currently has an NA sponsor and attends meetings.  Pt states he also had alcohol last night maybe 4 beers he can recall. Pt reports current stressors as his fiance has current custody of his daughter and he does not get to see his daughter as much. Pt reports current depressive symptoms: isolation, tearful, worthlessness, anxiety. Pt reports getting 4 to 5 hours of sleep and a good appetite. Pt currently denies SI,HI, AVH and SIB, reports no prior SI attempts. Pt reports mental health on fathers side of family, no trauma/ or abuse from him. Pt reports no current provider but states he did meet up with [facility] for assessment but did not complete it. Pt reports no psych medications but takes medications for hyperthyroidism. 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    Per IVC:  Respondent has no known mental health diagnosis at this point. Family states that respondent has substance abuse issues. Family states that respondent  Has hitory of substance abuse for several years,  Respondent has states that he intended to kill himself if his parents intervened and tried to help him. He has also stated that his life is not worth living. Family greatly concerned for well-being.    Disposition: Nira Conn, FNP recommends pt for inpatient treatment. Per [facility] at capacity, social work to seek placement for patient.  Diagnosis: Substance Use Disorder   Visit Diagnosis:   IVC   CCA Screening, Triage and Referral (STR)  Patient Reported Information How did you hear about [place]? [employer] Referral name: [employer] Referral phone number: No data recorded  Whom do you see for routine medical problems? No doctor Practice/Facility Name: No data recorded Practice/Facility Phone Number: No data recorded Name of Contact: No data recorded Contact Number: No data recorded Contact Fax Number: No data recorded Prescriber Name: No data recorded Prescriber Address (if known): No data recorded  What Is the Reason for Your Visit/Call Today? Drugs, relapse How Long Has This Been Causing You Problems? <Week  What Do You Feel Would Help You the Most Today? Assessment only  Have You Recently Been in Any Inpatient Treatment (Hospital/Detox/Crisis Center/28-Day Program)? No  Name/Location of Program/Hospital:No data recorded How Long Were You There? No data recorded When Were You Discharged? No data recorded  Have You Ever Received Services From [facility] Before? No  Who Do You See at [facility]? No data recorded  Have You Recently Had Any Thoughts About Hurting Yourself? No  Are You Planning to Commit Suicide/Harm Yourself At This time? No   Have you Recently Had Thoughts About Hurting Someone Karolee Ohs? No  Explanation: No data recorded  Have You Used Any Alcohol or Drugs in the Past 24 Hours? Yes  How Long Ago Did You Use Drugs or Alcohol? Pills and alcohol What Did You Use and How Much? unsure  Do You Currently Have a Therapist/Psychiatrist? No  Name of  Therapist/Psychiatrist: No data recorded  Have You Been Recently Discharged From Any Office Practice or Programs? No  Explanation of Discharge From Practice/Program: No data recorded    CCA Screening Triage Referral Assessment Type of Contact: Tele-Assessment  Is this Initial or Reassessment? Initial Assessment  Date Telepsych consult ordered in [facility]:  09/07/20  Time Telepsych consult ordered in [facility]:  No data recorded  Patient Reported Information Reviewed? Yes  Patient Left Without Being Seen? No data recorded Reason for Not Completing Assessment: No data recorded  Collateral Involvement: No data recorded  Does Patient Have a Court Appointed Legal Guardian? No data recorded Name and Contact of Legal Guardian: No data recorded If Minor and Not Living with Parent(s), Who has Custody? No data recorded Is CPS involved or ever been involved? Never  Is APS involved or ever been involved? Never   Patient Determined To Be At Risk for Harm To Self or Others Based on Review of Patient Reported Information or Presenting Complaint? No  Method: No data recorded Availability of Means: No data recorded Intent: No data recorded Notification Required: No data recorded Additional Information for Danger to Others Potential: No data recorded Additional Comments for Danger to Others Potential: No data recorded Are There Guns or Other Weapons in Your Home? No data recorded Types of Guns/Weapons: No data recorded Are These Weapons Safely Secured?                            No data recorded Who Could Verify You Are Able To Have These Secured: No data recorded Do You Have any Outstanding Charges, Pending Court Dates, Parole/Probation? No data recorded Contacted To Inform of Risk of Harm To Self or Others: No data recorded  Location of Assessment: [facility]   Does Patient Present under Involuntary Commitment? Yes  IVC Papers Initial File Date: 09/06/20   [place] of Residence:  [place]   Patient Currently Receiving the Following Services: Not Receiving Services   Determination of Need: Emergent (2 hours)   Options For Referral: Other: Comment     CCA Biopsychosocial  Intake/Chief Complaint:  CCA Intake With Chief Complaint CCA Part Two Date: 09/07/20 Chief Complaint/Presenting Problem:  (drug use, relapse)  Mental Health Symptoms Depression:  Depression: Hopelessness, Worthlessness, Duration of symptoms greater than two weeks, Tearfulness, Irritability, Sleep (too much or little)  Mania:  Mania: None  Anxiety:   Anxiety: Worrying, Difficulty concentrating, Irritability  Psychosis:  Psychosis: None  Trauma:  Trauma: None  Obsessions:  Obsessions: None  Compulsions:  Compulsions: None  Inattention:  Inattention: None  Hyperactivity/Impulsivity:  Hyperactivity/Impulsivity: N/A  Oppositional/Defiant Behaviors:  Oppositional/Defiant Behaviors: None  Emotional Irregularity:  Emotional Irregularity: None  Other Mood/Personality Symptoms:      Mental Status Exam Appearance and self-care  Stature:  Stature: Average  Weight:  Weight: Average weight  Clothing:  Clothing: Casual  Grooming:  Grooming: Normal  Cosmetic use:  Cosmetic Use: Age appropriate  Posture/gait:  Posture/Gait: Normal  Motor activity:  Motor Activity: Restless  Sensorium  Attention:  Attention: Distractible  Concentration:  Concentration: Anxiety interferes  Orientation:  Orientation: Place, Situation, Person, Time  Recall/memory:  Recall/Memory: Normal  Affect and Mood  Affect:  Affect: Anxious  Mood:  Mood: Anxious  Relating  Eye contact:  Eye Contact: Normal  Facial expression:  Facial Expression: Anxious  Attitude toward examiner:  Attitude Toward Examiner: Cooperative  Thought and Language  Speech flow: Speech Flow: Garbled, Clear and Coherent  Thought content:  Thought Content: Appropriate to Mood and Circumstances  Preoccupation:  Preoccupations: None   Hallucinations:  Hallucinations: None  Organization:     Company secretary of Knowledge:  Fund of Knowledge: Fair  Intelligence:  Intelligence: Average  Abstraction:  Abstraction: Normal  Judgement:  Judgement: Fair  Dance movement psychotherapist:  Reality Testing: Adequate  Insight:  Insight: Gaps, Fair  Decision Making:     Social Functioning  Social Maturity:  Social Maturity: Irresponsible  Social Judgement:  Social Judgement: Naive  Stress  Stressors:  Stressors: Other (Comment), Relationship, Transitions  Coping Ability:  Coping Ability: Overwhelmed  Skill Deficits:  Skill Deficits: None  Supports:  Supports: Support needed     Religion:    Leisure/Recreation: Leisure / Recreation Do You Have Hobbies?: No  Exercise/Diet: Exercise/Diet Do You Exercise?: No Have You Gained or Lost A Significant Amount of Weight in the Past Six Months?: No Do You Follow a Special Diet?: No Do You Have Any Trouble Sleeping?: No   CCA Employment/Education  Employment/Work Situation: Employment / Work Situation Employment situation: Employed Has patient ever been in the [employer]?: No  Education: Education Is Patient Currently Attending School?: Yes School Currently Attending:  Haroldine Laws) Did Garment/textile technologist From [employer]?: Yes Did Theme park manager?: Yes What Type of College Degree Do you Have?:  (uncg) Did You Attend Graduate School?: No Did You Have An Individualized Education Program (IIEP): No Did You Have Any Difficulty At School?: No Patient's Education Has Been Impacted by Current Illness: No   CCA Family/Childhood History  Family and Relationship History: Family history Marital status: Single Does patient have children?: Yes How many children?: 1  Childhood History:  Childhood History Did patient suffer any verbal/emotional/physical/sexual abuse as a child?: No Did patient suffer from severe childhood neglect?: No Has patient ever been sexually  abused/assaulted/raped as an adolescent or adult?: No Was the patient ever a victim of a crime or a disaster?: No Witnessed domestic violence?: No Has patient been affected by domestic violence as an adult?: No  Child/Adolescent Assessment:     CCA Substance Use  Alcohol/Drug Use: Alcohol / Drug Use Pain Medications: see MAR History of alcohol / drug use?: Yes Substance #1 Name of Substance 1:  (benzos)               ASAM's:  Six Dimensions of Multidimensional Assessment  Dimension 1:  Acute Intoxication and/or Withdrawal Potential:      Dimension 2:  Biomedical Conditions and Complications:      Dimension 3:  Emotional, Behavioral, or Cognitive Conditions and Complications:     Dimension 4:  Readiness to Change:     Dimension 5:  Relapse, Continued use, or Continued Problem Potential:     Dimension 6:  Recovery/Living Environment:     ASAM Severity Score:    ASAM Recommended Level of Treatment:     Substance use Disorder (SUD) Substance Use Disorder (SUD)  Checklist Symptoms of Substance Use: Presence of craving or strong urge to use  Recommendations for Services/Supports/Treatments: Recommendations  for Services/Supports/Treatments Recommendations For Services/Supports/Treatments: Inpatient Hospitalization, Medication Management, Individual Therapy  DSM5 Diagnoses: Patient Active Problem List   Diagnosis Date Noted  . Polysubstance abuse (HCC) 03/24/2018  . Alcohol dependence with withdrawal with complication (HCC) 03/24/2018  . Periorbital hematoma of left eye 03/24/2018  . Multiple abrasions 03/24/2018  . Opioid abuse (HCC) 08/29/2014  . Unspecified constipation 08/29/2014  . Admitted to substance misuse detoxification center 08/29/2014  . Colonic obstruction (HCC) 08/28/2014    Patient Centered Plan: Patient is on the following Treatment Plan(s):    Referrals to Alternative Service(s): Referred to Alternative Service(s):   Place:   Date:   Time:     Referred to Alternative Service(s):   Place:   Date:   Time:    Referred to Alternative Service(s):   Place:   Date:   Time:    Referred to Alternative Service(s):   Place:   Date:   Time:       Natasha MeadKiara M Durrell Barajas, LCSWA

## 2020-09-07 NOTE — ED Notes (Signed)
Pt alert and oriented x4. Pt denies SI/HI, A/VH, and any pain. Education, support, reassurance, and encouragement provided. Pt denies any concerns at this time, and verbally contracts for safety. Pt ambulating on the unit with no issues. Pt remains safe on the unit.

## 2020-09-07 NOTE — ED Notes (Signed)
Attempted to call for transport x3, no response. Will try again later.

## 2020-09-08 MED ORDER — NICOTINE 21 MG/24HR TD PT24
21.0000 mg | MEDICATED_PATCH | Freq: Every day | TRANSDERMAL | Status: DC
  Administered 2020-09-08: 21 mg via TRANSDERMAL
  Filled 2020-09-08: qty 1

## 2020-09-08 NOTE — ED Provider Notes (Signed)
FBC/OBS ASAP Discharge Summary  Date and Time: 09/08/2020 9:46 AM  Name: Gordon Gonzalez  MRN:  [id number]   Discharge Diagnoses:  Final diagnoses:  None    Subjective: "I feel fine."   Stay Summary: The patient was seen and assessed face to face. He is alert and oriented x 4. He is casually dressed and is appropriate to situation. He is calm and cooperative during the assessment, and participates. His speech is logical/coherent with a normal rate and tone. He denies SI/HI. He denies AVH. He does not appear to be responding to internal, or external stimuli. He denies alcohol withdrawal symptoms. He reported that his mother IVC'd him because he relapsed on drugs and now he feels threatened by her. He refused to give verbal consent for this provider to speak with his parents today.  We discussed treatment options to include residential treatment and outpatient services. He denied residential treatment. He reported that he was supposed to follow up with the [facility] in the past, but missed his appointment. He agreed to follow up with the [facility] for follow up. He reported that he will continue to attend NA meetings and stay in contact with his sponsor, [name]. He reported that he will return not be returning home with his parents after he was informed that his parents will not be allowing him to return back to his condo that they are paying for, and he will return home with them. He reported that he will go live with his girl friend [name], and gave verbal consent for this provider to obtain collateral information and discuss a safety plan. [name] agreed that [name] will come and live with her and did not have any concerns with the stated discharge plan. [name] was advised to removed all sharp objects and weapons in the home for the patient's safety. The patient will be picked up and transported home by his girlfriend [name].   The patient does not meet criteria  for inpatient treatment. Labs reviewed. Vital signs reviewed. Medications reviewed.   Total Time spent with patient: 20 minutes  Past Psychiatric History: Polysubstance, Alcohol dependence with withdrawal complication. Past Medical History:  Past Medical History:  Diagnosis Date  . Seizures (HCC)    No past surgical history on file. Family History: No family history on file. Family Psychiatric History: Unknown. Social History:  Social History   Substance and Sexual Activity  Alcohol Use Yes     Social History   Substance and Sexual Activity  Drug Use Yes  . Types: Cocaine   Comment: Opiates    Social History   Socioeconomic History  . Marital status: Single    Spouse name: Not on file  . Number of children: Not on file  . Years of education: Not on file  . Highest education level: Not on file  Occupational History  . Not on file  Tobacco Use  . Smoking status: Never Smoker  . Smokeless tobacco: Never Used  Substance and Sexual Activity  . Alcohol use: Yes  . Drug use: Yes    Types: Cocaine    Comment: Opiates  . Sexual activity: Not on file  Other Topics Concern  . Not on file  Social History Narrative  . Not on file   Social Determinants of Health   Financial Resource Strain:   . Difficulty of Paying Living Expenses: Not on file  Food Insecurity:   . Worried About [employer] of  Food in the Last Year: Not on file  . Ran Out of Food in the Last Year: Not on file  Transportation Needs:   . Lack of Transportation (Medical): Not on file  . Lack of Transportation (Non-Medical): Not on file  Physical Activity:   . Days of Exercise per Week: Not on file  . Minutes of Exercise per Session: Not on file  Stress:   . Feeling of Stress : Not on file  Social Connections:   . Frequency of Communication with Friends and Family: Not on file  . Frequency of Social Gatherings with Friends and Family: Not on file  . Attends Religious Services: Not on file  . Active  Member of Clubs or Organizations: Not on file  . Attends Banker Meetings: Not on file  . Marital Status: Not on file   SDOH:  SDOH Screenings   Alcohol Screen:   . Last Alcohol Screening Score (AUDIT): Not on file  Depression (PHQ2-9):   . PHQ-2 Score: Not on file  Financial Resource Strain:   . Difficulty of Paying Living Expenses: Not on file  Food Insecurity:   . Worried About Programme researcher, broadcasting/film/video in the Last Year: Not on file  . Ran Out of Food in the Last Year: Not on file  Housing:   . Last Housing Risk Score: Not on file  Physical Activity:   . Days of Exercise per Week: Not on file  . Minutes of Exercise per Session: Not on file  Social Connections:   . Frequency of Communication with Friends and Family: Not on file  . Frequency of Social Gatherings with Friends and Family: Not on file  . Attends Religious Services: Not on file  . Active Member of Clubs or Organizations: Not on file  . Attends Banker Meetings: Not on file  . Marital Status: Not on file  Stress:   . Feeling of Stress : Not on file  Tobacco Use: Low Risk   . Smoking Tobacco Use: Never Smoker  . Smokeless Tobacco Use: Never Used  Transportation Needs:   . Freight forwarder (Medical): Not on file  . Lack of Transportation (Non-Medical): Not on file    Has this patient used any form of tobacco in the last 30 days? (Cigarettes, Smokeless Tobacco, Cigars, and/or Pipes) A prescription for an FDA-approved tobacco cessation medication was offered at discharge and the patient refused  Current Medications:  Current Facility-Administered Medications  Medication Dose Route Frequency Provider Last Rate Last Admin  . acetaminophen (TYLENOL) tablet 650 mg  650 mg Oral Q6H PRN Aldean Baker, NP      . alum & mag hydroxide-simeth (MAALOX/MYLANTA) 200-200-20 MG/5ML suspension 30 mL  30 mL Oral Q4H PRN Aldean Baker, NP      . hydrOXYzine (ATARAX/VISTARIL) tablet 25 mg  25 mg Oral Q6H  PRN Aldean Baker, NP      . loperamide (IMODIUM) capsule 2-4 mg  2-4 mg Oral PRN Aldean Baker, NP      . LORazepam (ATIVAN) tablet 1 mg  1 mg Oral Q6H PRN Aldean Baker, NP   1 mg at 09/07/20 1756  . LORazepam (ATIVAN) tablet 1 mg  1 mg Oral QID Aldean Baker, NP   1 mg at 09/08/20 1610   Followed by  . [START ON 09/09/2020] LORazepam (ATIVAN) tablet 1 mg  1 mg Oral TID Aldean Baker, NP       Followed  by  . Melene Muller[START ON 09/10/2020] LORazepam (ATIVAN) tablet 1 mg  1 mg Oral BID Aldean BakerSykes, Janet E, NP       Followed by  . [START ON 09/11/2020] LORazepam (ATIVAN) tablet 1 mg  1 mg Oral Daily Marciano SequinSykes, Janet E, NP      . magnesium hydroxide (MILK OF MAGNESIA) suspension 30 mL  30 mL Oral Daily PRN Aldean BakerSykes, Janet E, NP      . multivitamin with minerals tablet 1 tablet  1 tablet Oral Daily Aldean BakerSykes, Janet E, NP   1 tablet at 09/08/20 0942  . ondansetron (ZOFRAN-ODT) disintegrating tablet 4 mg  4 mg Oral Q6H PRN Aldean BakerSykes, Janet E, NP      . thiamine tablet 100 mg  100 mg Oral Daily Aldean BakerSykes, Janet E, NP   100 mg at 09/08/20 0942  . traZODone (DESYREL) tablet 50 mg  50 mg Oral QHS PRN Aldean BakerSykes, Janet E, NP   50 mg at 09/07/20 2143   No current outpatient medications on file.    PTA Medications: (Not in a hospital admission)   Musculoskeletal  Strength & Muscle Tone: within normal limits Gait & Station: normal Patient leans: N/A  Psychiatric Specialty Exam  Presentation  General Appearance: Appropriate for Environment  Eye Contact:Fair  Speech:Clear and Coherent  Speech Volume:Normal  Handedness:Right   Mood and Affect  Mood:Anxious  Affect:Appropriate   Thought Process  Thought Processes:Coherent  Descriptions of Associations:Intact  Orientation:Full (Time, Place and Person)  Thought Content:WDL  Hallucinations:Hallucinations: None  Ideas of Reference:None  Suicidal Thoughts:Suicidal Thoughts: No  Homicidal Thoughts:Homicidal Thoughts: No   Sensorium  Memory:Immediate  Fair;Recent Fair;Remote Fair  Judgment:Fair  Insight:Fair   Executive Functions  Concentration:Fair  Attention Span:Fair  Recall:Fair  Fund of Knowledge:Fair  Language:Fair   Psychomotor Activity  Psychomotor Activity:No data recorded  Assets  Assets:Housing;Intimacy;Leisure Time;Physical Health;Social Support   Sleep  Sleep:Sleep: Fair   Physical Exam  Physical Exam Vitals and nursing note reviewed.  Constitutional:      Appearance: He is well-developed.  HENT:     Head: Normocephalic.  Eyes:     Pupils: Pupils are equal, round, and reactive to light.  Cardiovascular:     Rate and Rhythm: Normal rate.  Pulmonary:     Effort: Pulmonary effort is normal.  Musculoskeletal:        General: Normal range of motion.  Skin:    General: Skin is warm.  Neurological:     Mental Status: He is alert and oriented to person, place, and time.    Review of Systems  Constitutional: Negative.   HENT: Negative.   Eyes: Negative.   Respiratory: Negative.   Cardiovascular: Negative.   Gastrointestinal: Negative.   Genitourinary: Negative.   Musculoskeletal: Negative.   Skin: Negative.   Neurological: Negative.   Endo/Heme/Allergies: Negative.   Psychiatric/Behavioral: Positive for substance abuse.   Blood pressure (!) 111/91, pulse 85, temperature 97.9 F (36.6 C), temperature source Oral, resp. rate 18, SpO2 100 %. There is no height or weight on file to calculate BMI.  Demographic Factors:  Male and Caucasian  Loss Factors: NA  Historical Factors: Impulsivity  Risk Reduction Factors:   Sense of responsibility to family and Positive social support  Continued Clinical Symptoms:  Alcohol/Substance Abuse/Dependencies  Cognitive Features That Contribute To Risk:  None    Suicide Risk:  Minimal: No identifiable suicidal ideation.  Patients presenting with no risk factors but with morbid ruminations; may be classified as minimal risk based on the  severity  of the depressive symptoms  Plan Of Care/Follow-up recommendations:   Continue activity as tolerated. Continue diet as recommended by your PCP. Ensure to keep all appointments with outpatient providers.  Disposition: Follow up with the [facility] as scheduled.   Gerlene Burdock Viet Kemmerer, FNP 09/08/2020, 9:46 AM

## 2020-09-08 NOTE — ED Notes (Signed)
Patient discharged.

## 2020-09-08 NOTE — ED Notes (Signed)
Pt sleeping at present, no distress noted, ,monitoring for safety. 

## 2020-09-08 NOTE — Progress Notes (Signed)
Gordon Gonzalez received his AVS, questions answered and personal belongings retrieved. He was escorted to the lobby where his mother and girlfriend was waiting.

## 2020-09-08 NOTE — Progress Notes (Signed)
Received Gordon Gonzalez this morning asleep kin bed, he woke up and was cooperative with VS assessment. He ate breakfast and received his medications along with a nicotine patch. He denied all of the psychiatric symptoms this morning.

## 2020-09-08 NOTE — Discharge Instructions (Signed)
Patient is instructed prior to discharge to: Take all medications as prescribed by his/her mental healthcare provider. Report any adverse effects and or reactions from the medicines to his/her outpatient provider promptly. Patient has been instructed & cautioned: To not engage in alcohol and or illegal drug use while on prescription medicines. In the event of worsening symptoms, patient is instructed to call the crisis hotline, 911 and or go to the nearest ED for appropriate evaluation and treatment of symptoms. To follow-up with his/her primary care provider for your other medical issues, concerns and or health care needs.   Patient is instructed prior to discharge to: Take all medications as prescribed by his/her mental healthcare provider. Report any adverse effects and or reactions from the medicines to his/her outpatient provider promptly. Patient has been instructed & cautioned: To not engage in alcohol and or illegal drug use while on prescription medicines. In the event of worsening symptoms, patient is instructed to call the crisis hotline, 911 and or go to the nearest ED for appropriate evaluation and treatment of symptoms. To follow-up with his/her primary care provider for your other medical issues, concerns and or health care needs.

## 2021-05-11 ENCOUNTER — Other Ambulatory Visit: Payer: Self-pay

## 2021-05-11 DIAGNOSIS — F1099 Alcohol use, unspecified with unspecified alcohol-induced disorder: Secondary | ICD-10-CM | POA: Diagnosis not present

## 2021-05-11 DIAGNOSIS — R400 Somnolence: Secondary | ICD-10-CM | POA: Insufficient documentation

## 2021-05-11 DIAGNOSIS — F1320 Sedative, hypnotic or anxiolytic dependence, uncomplicated: Secondary | ICD-10-CM | POA: Insufficient documentation

## 2021-05-11 DIAGNOSIS — F1910 Other psychoactive substance abuse, uncomplicated: Secondary | ICD-10-CM | POA: Insufficient documentation

## 2021-05-11 DIAGNOSIS — Y900 Blood alcohol level of less than 20 mg/100 ml: Secondary | ICD-10-CM | POA: Insufficient documentation

## 2021-05-11 DIAGNOSIS — Z20822 Contact with and (suspected) exposure to covid-19: Secondary | ICD-10-CM | POA: Insufficient documentation

## 2021-05-12 ENCOUNTER — Encounter (HOSPITAL_COMMUNITY): Payer: Self-pay

## 2021-05-12 ENCOUNTER — Emergency Department (HOSPITAL_COMMUNITY): Payer: Managed Care, Other (non HMO)

## 2021-05-12 ENCOUNTER — Emergency Department (HOSPITAL_COMMUNITY)
Admission: EM | Admit: 2021-05-12 | Discharge: 2021-05-12 | Disposition: A | Discharge status: Home / Self Care | Payer: Managed Care, Other (non HMO) | Attending: Emergency Medicine | Admitting: Emergency Medicine

## 2021-05-12 DIAGNOSIS — F1910 Other psychoactive substance abuse, uncomplicated: Secondary | ICD-10-CM

## 2021-05-12 LAB — CBC WITH DIFFERENTIAL/PLATELET
Abs Immature Granulocytes: 0.01 10*3/uL (ref 0.00–0.07)
Basophils Absolute: 0 10*3/uL (ref 0.0–0.1)
Basophils Relative: 1 %
Eosinophils Absolute: 0.2 10*3/uL (ref 0.0–0.5)
Eosinophils Relative: 4 %
HCT: 40.6 % (ref 39.0–52.0)
Hemoglobin: 14.2 g/dL (ref 13.0–17.0)
Immature Granulocytes: 0 %
Lymphocytes Relative: 44 %
Lymphs Abs: 2.3 10*3/uL (ref 0.7–4.0)
MCH: 29 pg (ref 26.0–34.0)
MCHC: 35 g/dL (ref 30.0–36.0)
MCV: 82.9 fL (ref 80.0–100.0)
Monocytes Absolute: 0.5 10*3/uL (ref 0.1–1.0)
Monocytes Relative: 9 %
Neutro Abs: 2.2 10*3/uL (ref 1.7–7.7)
Neutrophils Relative %: 42 %
Platelets: 238 10*3/uL (ref 150–400)
RBC: 4.9 MIL/uL (ref 4.22–5.81)
RDW: 13.2 % (ref 11.5–15.5)
WBC: 5.1 10*3/uL (ref 4.0–10.5)
nRBC: 0 % (ref 0.0–0.2)

## 2021-05-12 LAB — COMPREHENSIVE METABOLIC PANEL
ALT: 18 U/L (ref 0–44)
AST: 25 U/L (ref 15–41)
Albumin: 4.4 g/dL (ref 3.5–5.0)
Alkaline Phosphatase: 53 U/L (ref 38–126)
Anion gap: 7 (ref 5–15)
BUN: 12 mg/dL (ref 6–20)
CO2: 26 mmol/L (ref 22–32)
Calcium: 9.4 mg/dL (ref 8.9–10.3)
Chloride: 105 mmol/L (ref 98–111)
Creatinine, Ser: 0.68 mg/dL (ref 0.61–1.24)
GFR, Estimated: >60 mL/min (ref >60)
Glucose, Bld: 102 mg/dL — ABNORMAL HIGH (ref 70–99)
Potassium: 4.3 mmol/L (ref 3.5–5.1)
Sodium: 138 mmol/L (ref 135–145)
Total Bilirubin: 1.7 mg/dL — ABNORMAL HIGH (ref 0.3–1.2)
Total Protein: 6.9 g/dL (ref 6.5–8.1)

## 2021-05-12 LAB — RESP PANEL BY RT-PCR (FLU A&B, COVID) ARPGX2
Influenza A by PCR: NEGATIVE
Influenza B by PCR: NEGATIVE
SARS Coronavirus 2 by RT PCR: NEGATIVE

## 2021-05-12 LAB — LIPASE, BLOOD: Lipase: 31 U/L (ref 11–51)

## 2021-05-12 LAB — ACETAMINOPHEN LEVEL: Acetaminophen (Tylenol), Serum: <10 ug/mL — ABNORMAL LOW (ref 10–30)

## 2021-05-12 LAB — LACTIC ACID, PLASMA: Lactic Acid, Venous: 0.9 mmol/L (ref 0.5–1.9)

## 2021-05-12 LAB — ETHANOL: Alcohol, Ethyl (B): <10 mg/dL (ref <10)

## 2021-05-12 LAB — SALICYLATE LEVEL: Salicylate Lvl: <7 mg/dL — ABNORMAL LOW (ref 7.0–30.0)

## 2021-05-12 MED ORDER — SODIUM CHLORIDE 0.9 % IV BOLUS
1000.0000 mL | Freq: Once | INTRAVENOUS | Status: AC
  Administered 2021-05-12: 02:00:00 1000 mL via INTRAVENOUS

## 2021-05-12 NOTE — Discharge Instructions (Addendum)
Please keep your appointment at [facility]. Return to the ED with any new or worsening symptoms.

## 2021-05-12 NOTE — ED Triage Notes (Signed)
Pt presents with girlfriend. States he is supposed to go to [facility] on Sunday morning for treatment. Pt reports to using IV cocaine over the last several days as well as alcohol, benzo's and THC edibles. Girlfriend also states patient hasn't slept in 3 days and ingested a lot of THC gummies tonight. Pt is very drowsy

## 2021-05-12 NOTE — ED Provider Notes (Signed)
Emergency Department Provider Note   I have reviewed the triage vital signs and the nursing notes.   HISTORY  Chief Complaint Drug Problem   HPI CORRAN LALONE is a 25 y.o. male with past medical history reviewed below including polysubstance abuse including IV cocaine use presents to the emergency department with a friend.  She states that he is due to check into [facility] on Sunday morning.  He has been abusing cocaine both IV and smoking crack.  He has been using multiple other substances in addition to this including alcohol, THC, benzodiazepine.  She tells me that he last used cocaine several days ago but has been up for multiple days and seeming very agitated.  He was drinking alcohol earlier this morning but not very much.  He seemed to become more somnolent which concerned her.  She thought maybe he felt warm to touch and got concerned about infection.  Denies any seizure activity.   Level 5 caveat: Somnolence.    Past Medical History:  Diagnosis Date   Seizures [facility])     Patient Active Problem List   Diagnosis Date Noted   Polysubstance abuse (HCC) 03/24/2018   Alcohol dependence with withdrawal with complication (HCC) 03/24/2018   Periorbital hematoma of left eye 03/24/2018   Multiple abrasions 03/24/2018   Opioid abuse (HCC) 08/29/2014   Unspecified constipation 08/29/2014   Admitted to substance misuse detoxification center 08/29/2014   Colonic obstruction (HCC) 08/28/2014    History reviewed. No pertinent surgical history.  Allergies Amoxicillin and Wellbutrin [bupropion]  History reviewed. No pertinent family history.  Social History Social History   Tobacco Use   Smoking status: Never   Smokeless tobacco: Never  Substance Use Topics   Alcohol use: Yes   Drug use: Yes    Types: Cocaine    Comment: Opiates    Review of Systems  Level 5 caveat: Somnolence   ____________________________________________   PHYSICAL EXAM:  VITAL  SIGNS: ED Triage Vitals  Enc Vitals Group     BP 05/12/21 0004 (!) 137/99     Pulse Rate 05/12/21 0004 (!) 109     Resp 05/12/21 0004 17     Temp 05/12/21 0004 97.8 F (36.6 C)     Temp Source 05/12/21 0004 Oral     SpO2 05/12/21 0004 100 %     Weight 05/12/21 0004 125 lb (56.7 kg)     Height 05/12/21 0004 5\' 8"  (1.727 m)    Constitutional: Somnolent but arouses to voice. Well appearing and in no acute distress. Eyes: Conjunctivae are normal.  Head: Atraumatic. Nose: No congestion/rhinnorhea. Mouth/Throat: Mucous membranes are dry.  Neck: No stridor.  Cardiovascular: Tachycardia. Good peripheral circulation. Grossly normal heart sounds.   Respiratory: Normal respiratory effort.  No retractions. Lungs CTAB. Gastrointestinal: Soft and nontender. No distention.  Musculoskeletal: No lower extremity tenderness nor edema. No gross deformities of extremities. Neurologic: Somnolent which limits exam. Pupils are 3 mm and reactive. Moving extremities equally.  Skin:  Skin is warm, dry and intact. No rash noted. No abscess.    ____________________________________________   LABS (all labs ordered are listed, but only abnormal results are displayed)  Labs Reviewed  COMPREHENSIVE METABOLIC PANEL - Abnormal; Notable for the following components:      Result Value   Glucose, Bld 102 (*)    Total Bilirubin 1.7 (*)    All other components within normal limits  ACETAMINOPHEN LEVEL - Abnormal; Notable for the following components:   Acetaminophen (  Tylenol), Serum <10 (*)    All other components within normal limits  SALICYLATE LEVEL - Abnormal; Notable for the following components:   Salicylate Lvl <7.0 (*)    All other components within normal limits  RESP PANEL BY RT-PCR (FLU A&B, COVID) [id number]  URINE CULTURE  CULTURE, BLOOD (ROUTINE X 2)  CULTURE, BLOOD (ROUTINE X 2)  LIPASE, BLOOD  LACTIC ACID, PLASMA  CBC WITH DIFFERENTIAL/PLATELET  ETHANOL     ____________________________________________  RADIOLOGY  CT Head Wo Contrast  Result Date: 05/12/2021 CLINICAL DATA:  Altered mental status EXAM: CT HEAD WITHOUT CONTRAST TECHNIQUE: Contiguous axial images were obtained from the base of the skull through the vertex without intravenous contrast. COMPARISON:  None. FINDINGS: Brain: Normal anatomic configuration. No abnormal intra or extra-axial mass lesion or fluid collection. No abnormal mass effect or midline shift. No evidence of acute intracranial hemorrhage or infarct. Ventricular size is normal. Cerebellum unremarkable. Vascular: Unremarkable Skull: Intact Sinuses/Orbits: Paranasal sinuses are clear. Orbits are unremarkable. Other: Mastoid air cells and middle ear cavities are clear. IMPRESSION: No acute intracranial hemorrhage or infarct. Electronically Signed   By: Helyn Numbers MD   On: 05/12/2021 03:03   DG Chest Portable 1 View  Result Date: 05/12/2021 CLINICAL DATA:  Altered mental status EXAM: PORTABLE CHEST 1 VIEW COMPARISON:  None. FINDINGS: The heart size and mediastinal contours are within normal limits. Both lungs are clear. The visualized skeletal structures are unremarkable. IMPRESSION: No active disease. Electronically Signed   By: Helyn Numbers MD   On: 05/12/2021 03:01    ____________________________________________   PROCEDURES  Procedure(s) performed:   Procedures  None  ____________________________________________   INITIAL IMPRESSION / ASSESSMENT AND PLAN / ED COURSE  Pertinent labs & imaging results that were available during my care of the patient were reviewed by me and considered in my medical decision making (see chart for details).   Patient presents to the emergency department with somnolence in the setting of recent cocaine use, benzodiazepine use, alcohol use, THC use.  Suspect polysubstance abuse with patient being awake for the last several days I suspect that this development of somnolence is  related to coming off of that bender. Will screen for sepsis but afebrile here. No visible source of infection. Will also obtain CT head.   CT head and labs reviewed.  No acute findings.  Patient is waking up here.  He is ambulatory without difficulty.  Suspect somnolence related to coming off of cocaine.  He has follow-up at [facility].  Will be discharged home with his friend at bedside.  ____________________________________________  FINAL CLINICAL IMPRESSION(S) / ED DIAGNOSES  Final diagnoses:  Polysubstance abuse (HCC)    MEDICATIONS GIVEN DURING THIS VISIT:  Medications  sodium chloride 0.9 % bolus 1,000 mL (0 mLs Intravenous Stopped 05/12/21 0459)    Note:  This document was prepared using Dragon voice recognition software and may include unintentional dictation errors.  Alona Bene, MD, [facility] Emergency Medicine    Deaundra Dupriest, Arlyss Repress, MD 05/13/21 [phone]

## 2021-05-17 LAB — CULTURE, BLOOD (ROUTINE X 2)
Culture: NO GROWTH
Culture: NO GROWTH
Special Requests: ADEQUATE

## 2022-03-13 IMAGING — CT CT HEAD W/O CM
3 series · 16 of 47 positions shown, 19 images · non-contrast
Comparison: None.

CLINICAL DATA: Altered mental status

EXAM:
CT HEAD WITHOUT CONTRAST
TECHNIQUE: Contiguous axial images were obtained from the base of the skull
through the vertex without intravenous contrast.

[Series 2: head wo · axial · 0.47mm/px · z∈[-126,-2]mm · 10 of 30 slices shown, 13 images]
[im 3/30  brain]
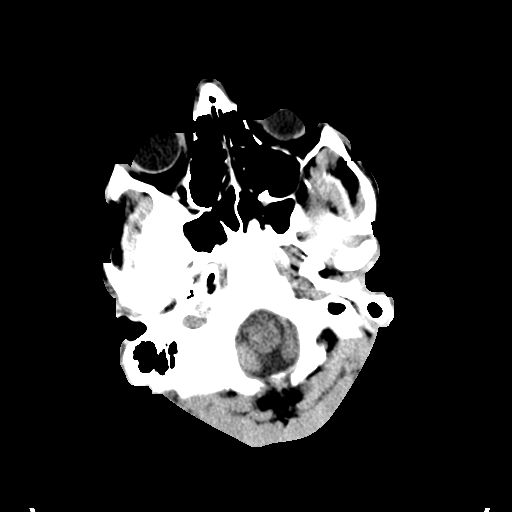
[im 3/30  bone]
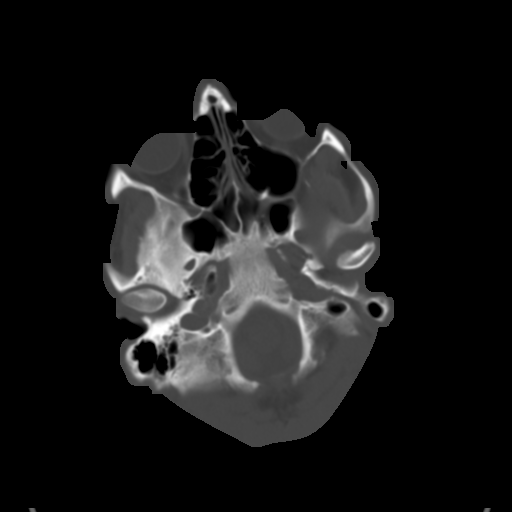
[im 6/30  brain]
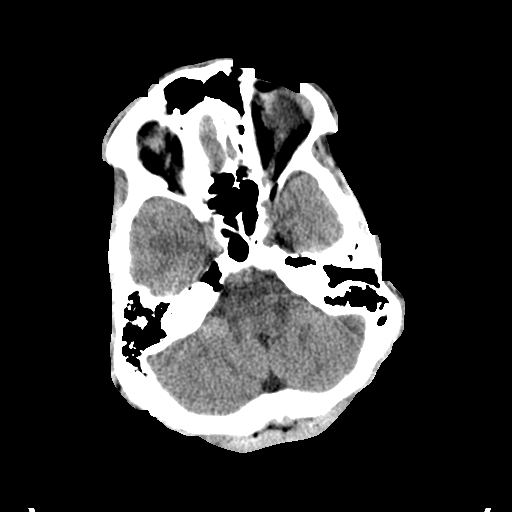
[im 9/30  brain]
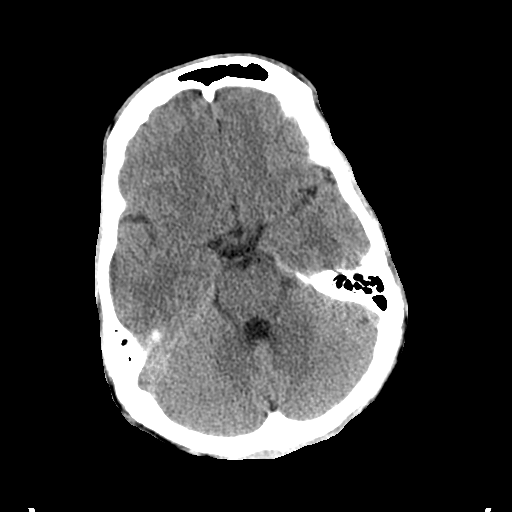
[im 11/30  brain]
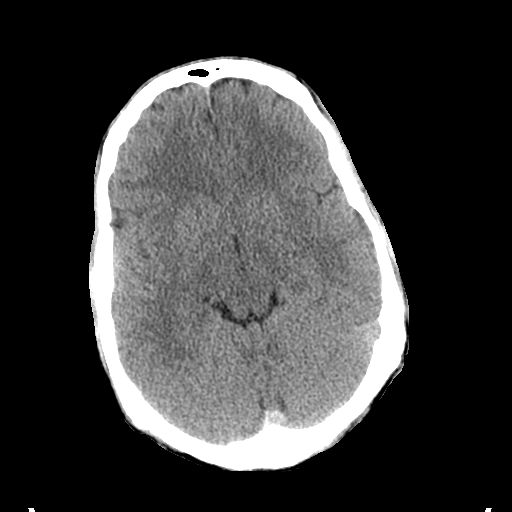
[im 14/30  brain]
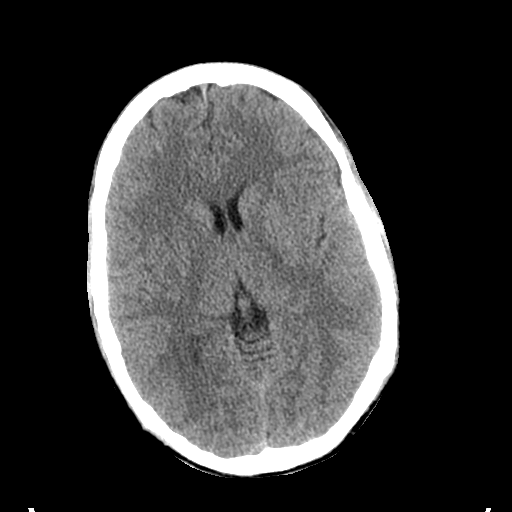
[im 14/30  bone]
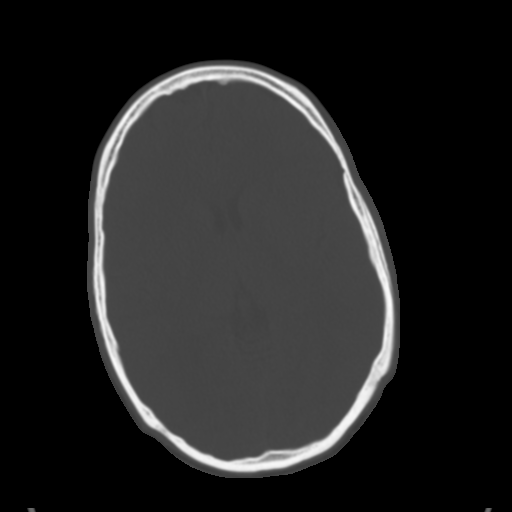
[im 17/30  brain]
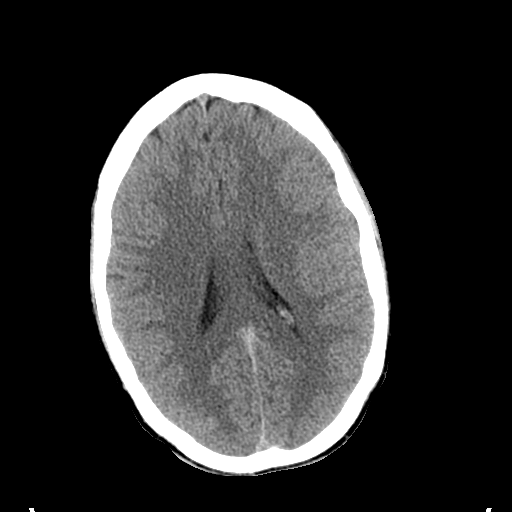
[im 20/30  brain]
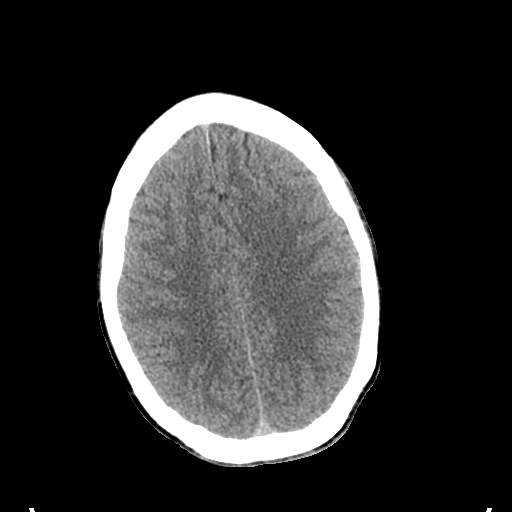
[im 23/30  brain]
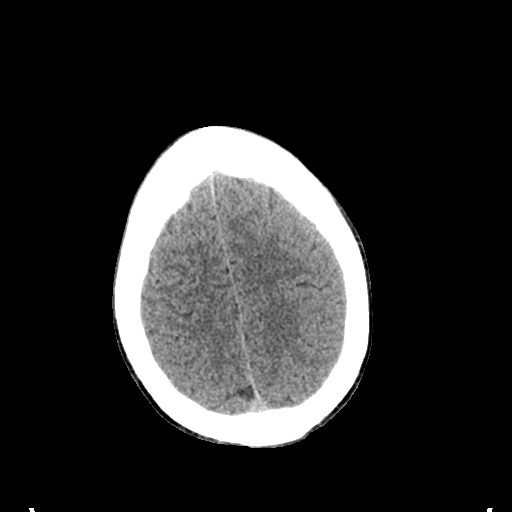
[im 25/30  brain]
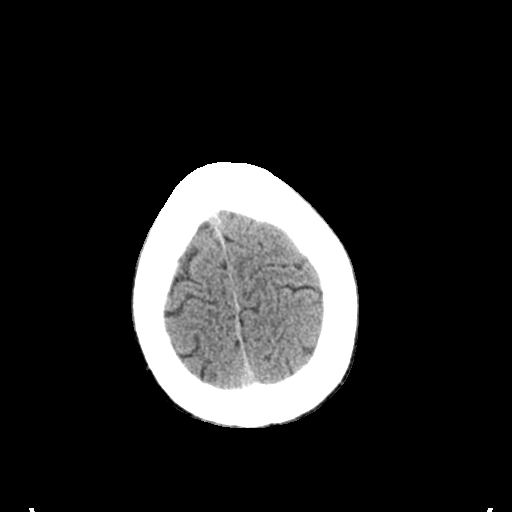
[im 25/30  bone]
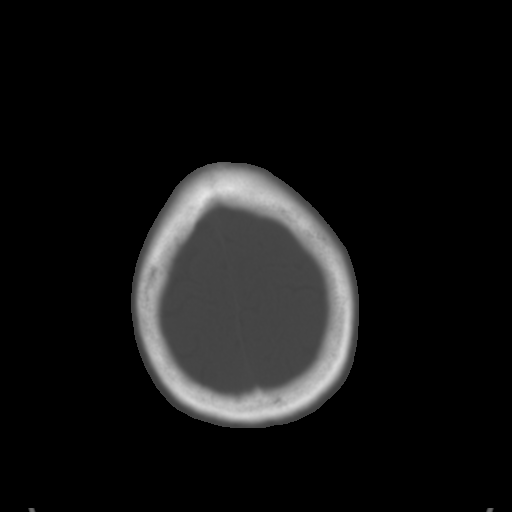
[im 28/30  brain]
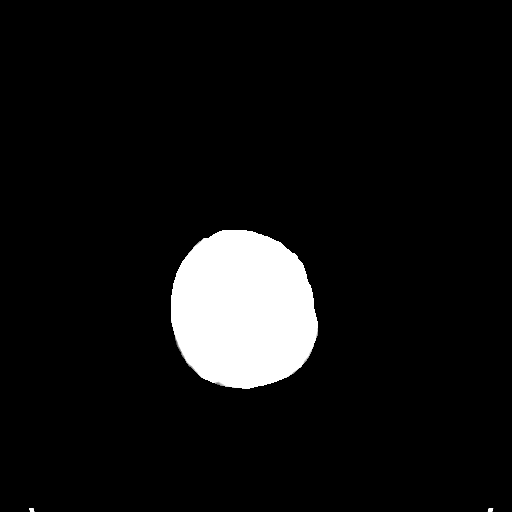

[Series 4: coronal soft tissue · coronal · 0.30mm/px · 3 of 68 slices shown]
[im 23/68  brain]
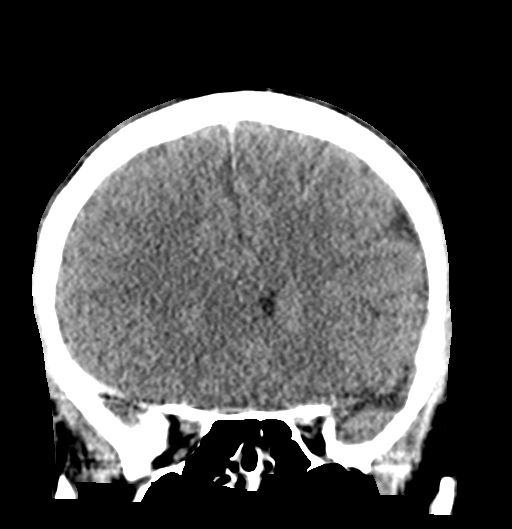
[im 30/68  brain]
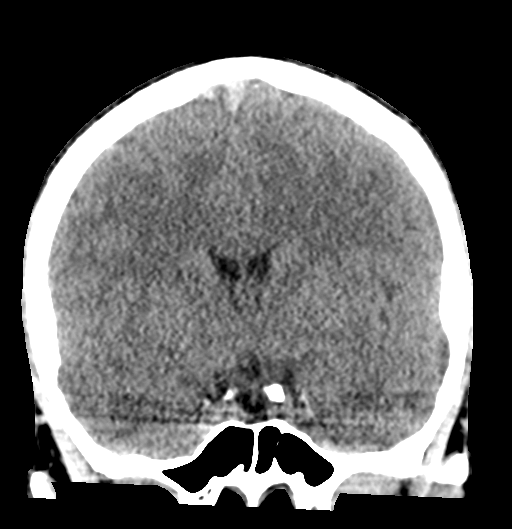
[im 38/68  brain]
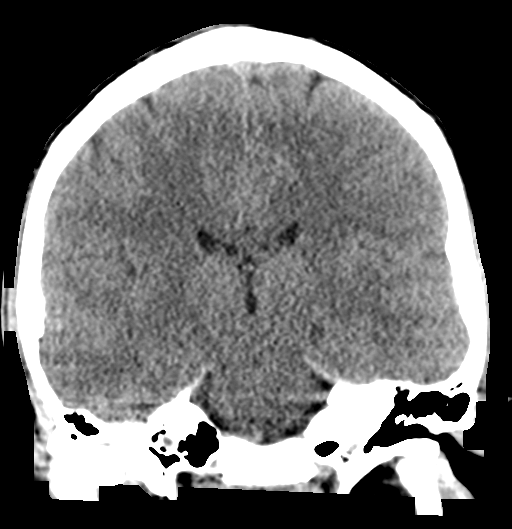

[Series 5: sagittal soft tissue · sagittal · 0.30mm/px · 3 of 50 slices shown]
[im 17/50  brain]
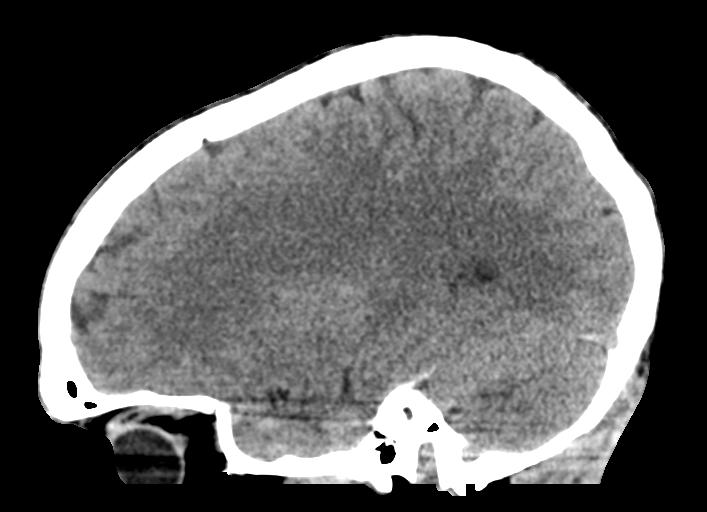
[im 25/50  brain]
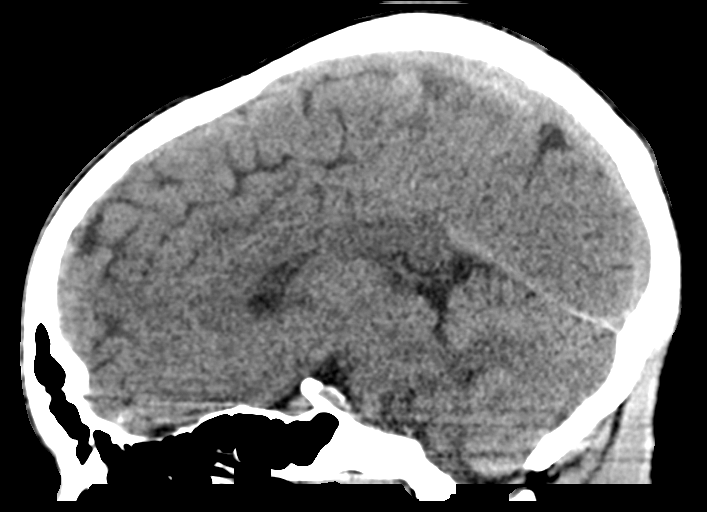
[im 33/50  brain]
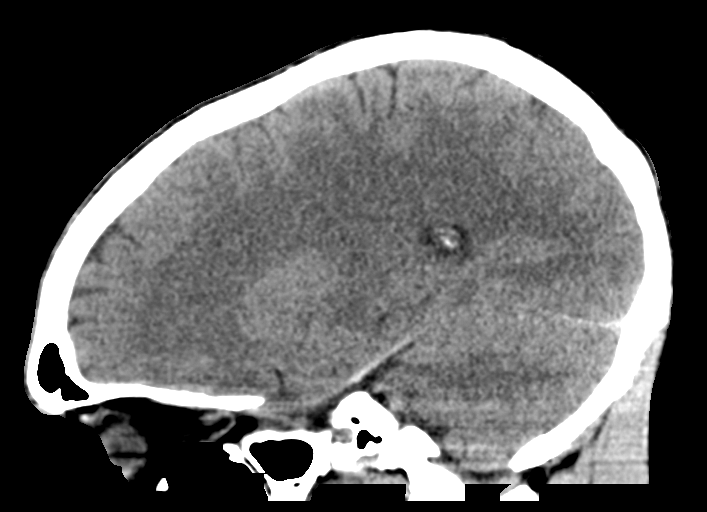

[16 of 47 positions shown; findings below may reference images not displayed]

FINDINGS: Brain: Normal anatomic configuration. No abnormal intra or
extra-axial mass lesion or fluid collection. No abnormal mass effect
or midline shift. No evidence of acute intracranial hemorrhage or
infarct. Ventricular size is normal. Cerebellum unremarkable.

Vascular: Unremarkable

Skull: Intact

Sinuses/Orbits: Paranasal sinuses are clear. Orbits are
unremarkable.

Other: Mastoid air cells and middle ear cavities are clear.
IMPRESSION: No acute intracranial hemorrhage or infarct.

## 2023-01-28 ENCOUNTER — Other Ambulatory Visit: Payer: Self-pay

## 2023-01-28 ENCOUNTER — Encounter: Payer: Self-pay | Admitting: *Deleted

## 2023-01-28 DIAGNOSIS — R1012 Left upper quadrant pain: Secondary | ICD-10-CM | POA: Insufficient documentation

## 2023-01-28 DIAGNOSIS — R634 Abnormal weight loss: Secondary | ICD-10-CM | POA: Insufficient documentation

## 2023-01-28 DIAGNOSIS — R1032 Left lower quadrant pain: Secondary | ICD-10-CM | POA: Insufficient documentation

## 2023-01-28 LAB — COMPREHENSIVE METABOLIC PANEL
ALT: 20 U/L (ref 0–44)
AST: 28 U/L (ref 15–41)
Albumin: 4.9 g/dL (ref 3.5–5.0)
Alkaline Phosphatase: 56 U/L (ref 38–126)
Anion gap: 9 (ref 5–15)
BUN: 13 mg/dL (ref 6–20)
CO2: 28 mmol/L (ref 22–32)
Calcium: 9.5 mg/dL (ref 8.9–10.3)
Chloride: 102 mmol/L (ref 98–111)
Creatinine, Ser: 0.84 mg/dL (ref 0.61–1.24)
GFR, Estimated: >60 mL/min (ref >60)
Glucose, Bld: 59 mg/dL — ABNORMAL LOW (ref 70–99)
Potassium: 4 mmol/L (ref 3.5–5.1)
Sodium: 139 mmol/L (ref 135–145)
Total Bilirubin: 1.4 mg/dL — ABNORMAL HIGH (ref 0.3–1.2)
Total Protein: 7.6 g/dL (ref 6.5–8.1)

## 2023-01-28 LAB — URINALYSIS, ROUTINE W REFLEX MICROSCOPIC
Bilirubin Urine: NEGATIVE
Glucose, UA: NEGATIVE mg/dL
Hgb urine dipstick: NEGATIVE
Ketones, ur: NEGATIVE mg/dL
Leukocytes,Ua: NEGATIVE
Nitrite: NEGATIVE
Protein, ur: NEGATIVE mg/dL
Specific Gravity, Urine: 1.005 (ref 1.005–1.030)
pH: 6 (ref 5.0–8.0)

## 2023-01-28 LAB — CBC
HCT: 47.2 % (ref 39.0–52.0)
Hemoglobin: 16.3 g/dL (ref 13.0–17.0)
MCH: 28.9 pg (ref 26.0–34.0)
MCHC: 34.5 g/dL (ref 30.0–36.0)
MCV: 83.7 fL (ref 80.0–100.0)
Platelets: 292 10*3/uL (ref 150–400)
RBC: 5.64 MIL/uL (ref 4.22–5.81)
RDW: 12.8 % (ref 11.5–15.5)
WBC: 7.4 10*3/uL (ref 4.0–10.5)
nRBC: 0 % (ref 0.0–0.2)

## 2023-01-28 LAB — LIPASE, BLOOD: Lipase: 52 U/L — ABNORMAL HIGH (ref 11–51)

## 2023-01-28 NOTE — ED Triage Notes (Signed)
Pt reports lower back pain radiating into left lower abd.  Sx for 2 weeks.  Pain worse today.   No n/v  no hx kidney stones  pt alert

## 2023-01-29 ENCOUNTER — Encounter (HOSPITAL_COMMUNITY): Payer: Self-pay

## 2023-01-29 ENCOUNTER — Emergency Department
Admission: EM | Admit: 2023-01-29 | Discharge: 2023-01-29 | Disposition: A | Discharge status: Home / Self Care | Payer: Self-pay | Attending: Emergency Medicine | Admitting: Emergency Medicine

## 2023-01-29 ENCOUNTER — Emergency Department: Payer: Self-pay

## 2023-01-29 DIAGNOSIS — R109 Unspecified abdominal pain: Secondary | ICD-10-CM

## 2023-01-29 NOTE — ED Provider Notes (Signed)
[facility] Provider Note    Event Date/Time   First MD Initiated Contact with Patient 01/29/23 0141     (approximate)   History   Abdominal Pain   HPI  Gordon Gonzalez is a 27 y.o. male who presents for evaluation of left lower quadrant and left upper quadrant abdominal pain and  unexplained weight loss over the last month.  He has not had any nausea or vomiting.  The pain sometimes radiates into his left flank but it seems to primarily be in the left upper quadrant and sometimes the lower part of his abdomen.  He has not had any recent traumas.  He is a long-distance runner and has still been able to run including running 10 miles yesterday, but he is concerned about having lost 20 pounds over the last month without significantly changing his routine.  He has no history of any specific medical issues.  He denies alcohol use and drug use although he said that he used to smoke a lot of marijuana but he recently stopped that.  He was taking some cough and creatine supplements but also stopped that a few weeks ago and he is concerned maybe he did some damage to his kidneys or his liver.  No recent fever, chest pain, shortness of breath, nor dysuria.  He reports that he has had a little bit of decreased urinary stream but still goes to the bathroom regularly.  He tries to stay hydrated particular given his exercise regimen.     Physical Exam   Triage Vital Signs: ED Triage Vitals  Enc Vitals Group     BP 01/28/23 2144 135/87     Pulse Rate 01/28/23 2144 81     Resp 01/28/23 2144 20     Temp 01/28/23 2144 97.7 F (36.5 C)     Temp Source 01/29/23 0202 Oral     SpO2 01/28/23 2144 100 %     Weight 01/28/23 2144 62.6 kg (138 lb)     Height 01/28/23 2144 1.753 m ('5\' 9"'$ )     Head Circumference --      Peak Flow --      Pain Score 01/28/23 2154 5     Pain Loc --      Pain Edu? --      Excl. in [place]? --     Most recent vital signs: Vitals:   01/29/23  0202 01/29/23 0550  BP: 105/62 110/69  Pulse: 72 69  Resp: 18 18  Temp: 98.2 F (36.8 C) 97.9 F (36.6 C)  SpO2: 100% 100%     General: Awake, no distress.  Well-appearing in general. CV:  Good peripheral perfusion.  Regular rate and rhythm. Resp:  Normal effort. Speaking easily and comfortably, no accessory muscle usage nor intercostal retractions.   Abd:  No distention.  Healthy, thin and muscular body habitus.  Tender to palpation in the left lower and left upper quadrants but without rebound or guarding.   ED Results / Procedures / Treatments   Labs (all labs ordered are listed, but only abnormal results are displayed) Labs Reviewed  LIPASE, BLOOD - Abnormal; Notable for the following components:      Result Value   Lipase 52 (*)    All other components within normal limits  COMPREHENSIVE METABOLIC PANEL - Abnormal; Notable for the following components:   Glucose, Bld 59 (*)    Total Bilirubin 1.4 (*)    All other components within normal limits  URINALYSIS, ROUTINE W REFLEX MICROSCOPIC - Abnormal; Notable for the following components:   Color, Urine STRAW (*)    APPearance CLEAR (*)    All other components within normal limits  CBC  CBG MONITORING, ED     RADIOLOGY I viewed and interpreted the patient's CT of the abdomen and pelvis.  No acute abnormalities identified.    PROCEDURES:  Critical Care performed: No  Procedures   MEDICATIONS ORDERED IN ED: Medications - No data to display   IMPRESSION / MDM / [place] / ED COURSE  I reviewed the triage vital signs and the nursing notes.                              Differential diagnosis includes, but is not limited to, musculoskeletal strain, diverticulitis, appendicitis, splenic injury, SBO/ileus, neoplasm.  Patient's presentation is most consistent with acute presentation with potential threat to life or bodily function.  Labs/studies ordered: CMP, urinalysis, CBC, lipase, CBG, CT abdomen  pelvis  The patient is well-appearing, healthy, with normal vitals.  I suspect that he does not have an emergent condition but he is very worried about the possibility and under the circumstances with the abdominal pain and recent weight loss it is reasonable to proceed with further evaluation.  His triage lab work is all reassuring other than some hypoglycemia but he is tolerating oral intake.  His lipase is very slightly above the upper limit of normal but is not likely to be contributory and he has no upper abdominal tenderness.  Plan is to proceed with a CT of the abdomen pelvis with IV and p.o. contrast for optimal imaging and if it is reassuring he can follow-up as an outpatient.     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  I talked to him about it and he understands that it will not be as revealing a study as it would be if he got the IV contrast.  He says he just cannot make himself do it.  Therefore we will proceed with the oral contrast he has already ingested and a non-IV contrasted study. [CF]  0530 CT ABDOMEN PELVIS WO CONTRAST I viewed and interpreted the patient's CT of the abdomen and pelvis.  I see no evidence of SBO, ileus, acute infectious process, tumor, etc.  I also read the radiologist's report, which confirmed no acute findings. [CF]  0530 I updated the patient and encouraged close outpatient follow-up and will provide him with information for how to establish primary care, but at this point there is no indication of an emergent medical condition.  He states that he understands and is in agreement with the plan for outpatient follow-up.  I gave my usual and customary return precautions.  [CF]    Clinical Course User Index [CF] Hinda Kehr, MD     FINAL CLINICAL IMPRESSION(S) / ED DIAGNOSES   Final diagnoses:  Abdominal pain, unspecified abdominal location     Rx / DC Orders   ED Discharge  Orders     None        Note:  This document was prepared using Dragon voice recognition software and may include unintentional dictation errors.   Hinda Kehr, MD 01/29/23 [phone]

## 2023-01-29 NOTE — Discharge Instructions (Signed)

## 2023-01-30 ENCOUNTER — Telehealth: Payer: Managed Care, Other (non HMO) | Admitting: Family Medicine

## 2023-01-30 ENCOUNTER — Ambulatory Visit: Payer: Self-pay | Admitting: *Deleted

## 2023-01-30 DIAGNOSIS — R109 Unspecified abdominal pain: Secondary | ICD-10-CM

## 2023-01-30 NOTE — Progress Notes (Signed)
Gordon Gonzalez   Needs to have in person assessment given local, pain severity, and concern.  Patient acknowledged agreement and understanding of the plan.

## 2023-01-30 NOTE — Telephone Encounter (Signed)
  Chief Complaint: Flank pain Symptoms: Left sided flank pain 7/10. Seen in ED yesterday, all negative. States has now found a lump that may be a hernia post lifting weights. Frequency: 1-2 weeks back pain, flank pain yesterday Pertinent Negatives: Patient denies  Disposition: [x]$ ED /[]$ Urgent Care (no appt availability in office) / []$ Appointment(In office/virtual)/ []$  [facility]/ []$ Home Care/ []$ Refused Recommended Disposition /[]$ [facility]/ []$  Follow-up with PCP Additional Notes: Advised ED. No PCP Reason for Disposition  [1] SEVERE pain (e.g., excruciating, scale 8-10) AND [2] not improved after pain medicine  Answer Assessment - Initial Assessment Questions 1. LOCATION: "Where does it hurt?" (e.g., left, right)     Left side 2. ONSET: "When did the pain start?"     1-2 weeks, back. Saturday 3. SEVERITY: "How bad is the pain?" (e.g., Scale 1-10; mild, moderate, or severe)   - MILD (1-3): doesn't interfere with normal activities    - MODERATE (4-7): interferes with normal activities or awakens from sleep    - SEVERE (8-10): excruciating pain and patient unable to do normal activities (stays in bed)       7/10 4. PATTERN: "Does the pain come and go, or is it constant?"      Comes and goes 5. CAUSE: "What do you think is causing the pain?"     Unsure 6. OTHER SYMPTOMS:  "Do you have any other symptoms?" (e.g., fever, abdomen pain, vomiting, leg weakness, burning with urination, blood in urine)     Nausea  Protocols used: Flank Pain-A-AH

## 2023-02-06 ENCOUNTER — Encounter: Payer: Self-pay | Admitting: Urology

## 2023-03-08 ENCOUNTER — Ambulatory Visit: Payer: Self-pay | Admitting: Family Medicine

## 2023-05-18 ENCOUNTER — Ambulatory Visit: Payer: Self-pay

## 2023-07-03 ENCOUNTER — Encounter: Payer: Self-pay | Admitting: Gastroenterology

## 2023-09-30 ENCOUNTER — Ambulatory Visit: Payer: Medicaid Other | Admitting: Gastroenterology

## 2024-03-16 ENCOUNTER — Telehealth: Payer: Self-pay

## 2024-03-16 ENCOUNTER — Other Ambulatory Visit

## 2024-03-16 ENCOUNTER — Ambulatory Visit: Payer: Self-pay

## 2024-03-16 ENCOUNTER — Ambulatory Visit
Admission: RE | Admit: 2024-03-16 | Discharge: 2024-03-16 | Disposition: A | Discharge status: Home / Self Care | Source: Ambulatory Visit | Attending: Nurse Practitioner | Admitting: Nurse Practitioner

## 2024-03-16 VITALS — BP 111/75 | HR 70 | Temp 97.8°F | Resp 18

## 2024-03-16 DIAGNOSIS — N649 Disorder of breast, unspecified: Secondary | ICD-10-CM

## 2024-03-16 NOTE — Telephone Encounter (Signed)
 Pt called about [facility] not performing Ultrasounds. Pt was told he should contact [facility].

## 2024-03-16 NOTE — Discharge Instructions (Addendum)
 You were seen today for a lesion on your left breast. An ultrasound has been ordered to help determine whether the lump is a fluid-filled cyst, a solid mass, or another type of growth. Further evaluation and recommendations will be made based on the results of the ultrasound.

## 2024-03-16 NOTE — ED Provider Notes (Signed)
 [facility]    CSN: [id number] Arrival date & time: 03/16/24  1415      History   Chief Complaint No chief complaint on file.   HPI Gordon Gonzalez is a 28 y.o. male.   Gordon Gonzalez is a 28 year old male who presents for evaluation of a breast mass that he first noticed approximately two weeks ago. He reports mild discomfort with touch and when lying on the affected side but denies any itching, burning, prickling, or shooting pain. He has not observed any changes in the size, diameter, or color of the mass, and there has been no bleeding, drainage, or nipple discharge. He denies any recent injury to the chest or breast area. The patient does not routinely perform self-breast exams and denies any personal history of breast cancer.  The following portions of the patient's history were reviewed and updated as appropriate: allergies, current medications, past family history, past medical history, past social history, past surgical history, and problem list.          Past Medical History:  Diagnosis Date   Seizures [facility])     Patient Active Problem List   Diagnosis Date Noted   Polysubstance abuse (HCC) 03/24/2018   Alcohol dependence with withdrawal with complication (HCC) 03/24/2018   Periorbital hematoma of left eye 03/24/2018   Multiple abrasions 03/24/2018   Opioid abuse (HCC) 08/29/2014   Constipation 08/29/2014   Admitted to substance misuse detoxification center 08/29/2014   Colonic obstruction (HCC) 08/28/2014    Past Surgical History:  Procedure Laterality Date   colonoscopy     2024       Home Medications    Prior to Admission medications   Not on File    Family History History reviewed. No pertinent family history.  Social History Social History   Tobacco Use   Smoking status: Former    Types: Cigarettes   Smokeless tobacco: Never  Vaping Use   Vaping status: Former  Substance Use Topics   Alcohol use: Not Currently   Drug  use: Yes    Types: Cocaine    Comment: Opiates     Allergies   Amoxicillin, Wellbutrin [bupropion], and Wellbutrin [bupropion]   Review of Systems Review of Systems  Constitutional:  Negative for fever.  Skin:  Positive for wound. Negative for color change and rash.  All other systems reviewed and are negative.    Physical Exam Triage Vital Signs ED Triage Vitals  Encounter Vitals Group     BP 03/16/24 1507 111/75     Systolic BP Percentile --      Diastolic BP Percentile --      Pulse Rate 03/16/24 1507 70     Resp 03/16/24 1507 18     Temp 03/16/24 1507 97.8 F (36.6 C)     Temp Source 03/16/24 1507 Oral     SpO2 03/16/24 1507 97 %     Weight --      Height --      Head Circumference --      Peak Flow --      Pain Score 03/16/24 1506 5     Pain Loc --      Pain Education --      Exclude from Growth Chart --    No data found.  Updated Vital Signs BP 111/75 (BP Location: Right Arm)   Pulse 70   Temp 97.8 F (36.6 C) (Oral)   Resp 18   SpO2 97%  Visual Acuity Right Eye Distance:   Left Eye Distance:   Bilateral Distance:    Right Eye Near:   Left Eye Near:    Bilateral Near:     Physical Exam Vitals reviewed. Exam conducted with a chaperone present.  Constitutional:      General: He is not in acute distress.    Appearance: Normal appearance. He is not toxic-appearing.  HENT:     Head: Normocephalic.     Mouth/Throat:     Mouth: Mucous membranes are moist.  Eyes:     Conjunctiva/sclera: Conjunctivae normal.  Cardiovascular:     Rate and Rhythm: Normal rate and regular rhythm.     Heart sounds: Normal heart sounds.  Pulmonary:     Effort: Pulmonary effort is normal.     Breath sounds: Normal breath sounds.  Chest:  Breasts:    Breasts are symmetrical.     Right: Normal.     Left: Mass and tenderness present. No swelling, bleeding, inverted nipple, nipple discharge or skin change.       Comments: A palpable lesion measuring less than 2  cm is noted superior to the left nipple. The lesion is smooth, mobile, and with minimal tenderness. No overlying skin dimpling, swelling, erythema, or fluctuance is observed. Nipple appears normal and without discharge. No skin changes noted.  Musculoskeletal:        General: Normal range of motion.  Skin:    General: Skin is warm and dry.  Neurological:     General: No focal deficit present.     Mental Status: He is alert and oriented to person, place, and time.      UC Treatments / Results  Labs (all labs ordered are listed, but only abnormal results are displayed) Labs Reviewed - No data to display  EKG   Radiology No results found.  Procedures Procedures (including critical care time)  Medications Ordered in UC Medications - No data to display  Initial Impression / Assessment and Plan / UC Course  I have reviewed the triage vital signs and the nursing notes.  Pertinent labs & imaging results that were available during my care of the patient were reviewed by me and considered in my medical decision making (see chart for details).    28 year old male presenting with a two-week history of a left breast mass associated with mild discomfort on touch and when lying on the affected side. He denies itching, burning, prickling, shooting pain, or any changes in size, diameter, or color of the mass. No bleeding, drainage, nipple discharge, injury, or personal history of breast cancer reported. Patient is afebrile and nontoxic. Physical exam reveals a smooth, mobile, palpable lesion less than 2 cm in size located superior to the left nipple, with minimal tenderness. No skin dimpling, swelling, erythema, fluctuance, or nipple abnormalities noted. The etiology is unclear at this time. A breast ultrasound has been ordered to further evaluate the lesion and determine if it represents a fluid-filled cyst, solid mass, or other growth. Additional recommendations will be based on the imaging  results.  Today's evaluation has revealed no signs of a dangerous process. Discussed diagnosis with patient and/or guardian. Patient and/or guardian aware of their diagnosis, possible red flag symptoms to watch out for and need for close follow up. Patient and/or guardian understands verbal and written discharge instructions. Patient and/or guardian comfortable with plan and disposition.  Patient and/or guardian has a clear mental status at this time, good insight into illness (after discussion and  teaching) and has clear judgment to make decisions regarding their care  Documentation was completed with the aid of voice recognition software. Transcription may contain typographical errors. Final Clinical Impressions(s) / UC Diagnoses   Final diagnoses:  Breast lesion     Discharge Instructions      You were seen today for a lesion on your left breast. An ultrasound has been ordered to help determine whether the lump is a fluid-filled cyst, a solid mass, or another type of growth. Further evaluation and recommendations will be made based on the results of the ultrasound.      ED Prescriptions   None    PDMP not reviewed this encounter.   Maryruth Sol, [place] 03/16/24 [phone]

## 2024-03-16 NOTE — Telephone Encounter (Signed)
 Called [facility] to schedule an appointment for ultrasound. Pt is aware of appointment and verbalized understanding of arrival time.

## 2024-03-16 NOTE — ED Triage Notes (Signed)
 Pt presents with a lump under the lt nipple. Pt states it popped up 2 wks ago and has had pain when he lays on it.

## 2024-03-17 ENCOUNTER — Other Ambulatory Visit: Payer: Self-pay | Admitting: Nurse Practitioner

## 2024-03-17 DIAGNOSIS — N649 Disorder of breast, unspecified: Secondary | ICD-10-CM

## 2024-03-30 ENCOUNTER — Ambulatory Visit: Admission: RE | Admit: 2024-03-30 | Source: Ambulatory Visit

## 2024-03-30 ENCOUNTER — Ambulatory Visit

## 2024-03-30 ENCOUNTER — Ambulatory Visit
Admission: RE | Admit: 2024-03-30 | Discharge: 2024-03-30 | Disposition: A | Discharge status: Home / Self Care | Source: Ambulatory Visit | Attending: Nurse Practitioner | Admitting: Nurse Practitioner

## 2024-03-30 DIAGNOSIS — N649 Disorder of breast, unspecified: Secondary | ICD-10-CM

## 2024-04-01 ENCOUNTER — Telehealth (HOSPITAL_COMMUNITY): Payer: Self-pay | Admitting: Nurse Practitioner

## 2024-04-01 NOTE — Telephone Encounter (Signed)
-----   Message from Nurse Lynnette Saucer sent at 04/01/2024  4:17 PM EDT ----- Hello, please advise if any updates are needed for pt's ultrasound. Thank you.

## 2024-04-01 NOTE — Telephone Encounter (Signed)
 Patient's mammogram showed no signs of malignancy. However, mild left gynecomastia was noted. Patient should follow-up with PCP if still experiencing symptoms or has any concerns for further evaluation.

## 2024-08-30 ENCOUNTER — Ambulatory Visit
Admission: RE | Admit: 2024-08-30 | Discharge: 2024-08-30 | Disposition: A | Discharge status: Home / Self Care | Source: Ambulatory Visit | Attending: Family Medicine | Admitting: Family Medicine

## 2024-08-30 VITALS — BP 122/83 | HR 88 | Temp 98.0°F | Resp 18

## 2024-08-30 DIAGNOSIS — J189 Pneumonia, unspecified organism: Secondary | ICD-10-CM | POA: Diagnosis not present

## 2024-08-30 MED ORDER — ALBUTEROL SULFATE HFA 108 (90 BASE) MCG/ACT IN AERS: 1.0000 | INHALATION_SPRAY | Freq: Four times a day (QID) | RESPIRATORY_TRACT | 0 refills | Status: AC | PRN

## 2024-08-30 MED ORDER — AZITHROMYCIN 250 MG PO TABS: ORAL_TABLET | ORAL | 0 refills | Status: AC

## 2024-08-30 MED ORDER — PREDNISONE 20 MG PO TABS: ORAL_TABLET | ORAL | 0 refills | Status: AC

## 2024-08-30 MED ORDER — AMOXICILLIN-POT CLAVULANATE 875-125 MG PO TABS: 1.0000 | ORAL_TABLET | Freq: Two times a day (BID) | ORAL | 0 refills | Status: AC

## 2024-08-30 NOTE — ED Provider Notes (Signed)
 [facility]  Note:  This document was prepared using Conservation officer, historic buildings and may include unintentional dictation errors.  MRN: [id number] DOB: [birth year]  Subjective:   Gordon Gonzalez is a 28 y.o. male presenting for 2 week history of persistent sinus congestion, chest congestion, productive cough, wheezing. Has been using his inhaler. Has been working on quitting smoking. Was doing just under 1ppd. Took amoxicillin and finished 1 week ago. Felt some relief for ~1 day.   No current facility-administered medications for this encounter.  Current Outpatient Medications:    Phenyleph-Doxylamine-DM-APAP (ALKA-SELTZER NIGHT COLD & FLU PO), Take by mouth., Disp: , Rfl:    Allergies  Allergen Reactions   Amoxicillin    Wellbutrin [Bupropion] Hives and Swelling   Wellbutrin [Bupropion] Hives    Past Medical History:  Diagnosis Date   Seizures [facility])      Past Surgical History:  Procedure Laterality Date   colonoscopy     2024    History reviewed. No pertinent family history.  Social History   Tobacco Use   Smoking status: Some Days    Types: Cigarettes   Smokeless tobacco: Never  Vaping Use   Vaping status: Former  Substance Use Topics   Alcohol use: Not Currently   Drug use: Not Currently    Types: Cocaine    Comment: Opiates    ROS   Objective:   Vitals: BP 122/83 (BP Location: Left Arm)   Pulse 88   Temp 98 F (36.7 C) (Oral)   Resp 18   SpO2 97%   Physical Exam Constitutional:      General: He is not in acute distress.    Appearance: Normal appearance. He is well-developed. He is not ill-appearing, toxic-appearing or diaphoretic.  HENT:     Head: Normocephalic and atraumatic.     Right Ear: External ear normal.     Left Ear: External ear normal.     Nose: Congestion present. No rhinorrhea.     Mouth/Throat:     Mouth: Mucous membranes are moist.  Eyes:     General: No scleral icterus.       Right eye: No  discharge.        Left eye: No discharge.     Extraocular Movements: Extraocular movements intact.  Cardiovascular:     Rate and Rhythm: Normal rate and regular rhythm.     Heart sounds: Normal heart sounds. No murmur heard.    No friction rub. No gallop.  Pulmonary:     Effort: Pulmonary effort is normal. No respiratory distress.     Breath sounds: No stridor. Examination of the right-upper field reveals rales. Examination of the right-middle field reveals rales. Rales present. No wheezing or rhonchi.  Neurological:     Mental Status: He is alert and oriented to person, place, and time.  Psychiatric:        Mood and Affect: Mood normal.        Behavior: Behavior normal.        Thought Content: Thought content normal.     Assessment and Plan :   PDMP not reviewed this encounter.  1. Pneumonia of right middle lobe due to infectious organism    Will treat clinically given physical exam findings, trajectory of this illness.  Recommended starting Augmentin, azithromycin for pneumonia of the right middle lobe.  Given his smoking and respiratory symptoms, refilled his albuterol and advised an oral prednisone course.  Counseled patient on potential for  adverse effects with medications prescribed/recommended today, ER and return-to-clinic precautions discussed, patient verbalized understanding.    Christopher Savannah, [place] 08/30/24 1319

## 2024-08-30 NOTE — Discharge Instructions (Signed)
 Start Augmentin and a Z-pack for pneumonia of the right middle lobe. Use prednisone and albuterol to help with your breathing.

## 2024-08-30 NOTE — ED Triage Notes (Signed)
 Pt reports wheezing, chest congestion, cough, nasal congestion x 2 weeks. Antibiotic gave no relief, finished 1 week ago. Alka Seltzer gives no relief.
